# Patient Record
Sex: Female | Born: 1960 | Race: White | Hispanic: No | Marital: Single | Smoking: Never smoker
Health system: Southern US, Community
[De-identification: ages and names within clinical notes are randomized; demographics above are authoritative.]

## PROBLEM LIST (undated history)

## (undated) DIAGNOSIS — R519 Headache, unspecified: Secondary | ICD-10-CM

## (undated) DIAGNOSIS — C439 Malignant melanoma of skin, unspecified: Secondary | ICD-10-CM

## (undated) DIAGNOSIS — C4492 Squamous cell carcinoma of skin, unspecified: Secondary | ICD-10-CM

## (undated) DIAGNOSIS — B019 Varicella without complication: Secondary | ICD-10-CM

## (undated) DIAGNOSIS — R51 Headache: Secondary | ICD-10-CM

## (undated) DIAGNOSIS — I10 Essential (primary) hypertension: Secondary | ICD-10-CM

## (undated) DIAGNOSIS — G43909 Migraine, unspecified, not intractable, without status migrainosus: Secondary | ICD-10-CM

## (undated) DIAGNOSIS — T7840XA Allergy, unspecified, initial encounter: Secondary | ICD-10-CM

## (undated) HISTORY — DX: Allergy, unspecified, initial encounter: T78.40XA

## (undated) HISTORY — DX: Varicella without complication: B01.9

## (undated) HISTORY — DX: Migraine, unspecified, not intractable, without status migrainosus: G43.909

## (undated) HISTORY — DX: Headache, unspecified: R51.9

## (undated) HISTORY — DX: Malignant melanoma of skin, unspecified: C43.9

## (undated) HISTORY — DX: Essential (primary) hypertension: I10

## (undated) HISTORY — DX: Squamous cell carcinoma of skin, unspecified: C44.92

## (undated) HISTORY — PX: SKIN BIOPSY: SHX1

## (undated) HISTORY — DX: Headache: R51

---

## 2006-04-01 ENCOUNTER — Ambulatory Visit: Payer: Self-pay | Admitting: Internal Medicine

## 2006-05-27 ENCOUNTER — Ambulatory Visit: Payer: Self-pay | Admitting: Endocrinology

## 2007-09-17 ENCOUNTER — Ambulatory Visit: Payer: Self-pay | Admitting: Internal Medicine

## 2008-11-11 DIAGNOSIS — C439 Malignant melanoma of skin, unspecified: Secondary | ICD-10-CM

## 2008-11-11 HISTORY — DX: Malignant melanoma of skin, unspecified: C43.9

## 2008-12-07 ENCOUNTER — Ambulatory Visit: Payer: Self-pay | Admitting: Internal Medicine

## 2010-01-02 ENCOUNTER — Ambulatory Visit: Payer: Self-pay | Admitting: Internal Medicine

## 2010-02-08 DIAGNOSIS — C44612 Basal cell carcinoma of skin of right upper limb, including shoulder: Secondary | ICD-10-CM

## 2010-02-08 DIAGNOSIS — D239 Other benign neoplasm of skin, unspecified: Secondary | ICD-10-CM

## 2010-02-08 HISTORY — DX: Other benign neoplasm of skin, unspecified: D23.9

## 2010-02-08 HISTORY — DX: Basal cell carcinoma of skin of right upper limb, including shoulder: C44.612

## 2010-08-08 ENCOUNTER — Ambulatory Visit: Payer: Self-pay | Admitting: Gastroenterology

## 2010-08-31 DIAGNOSIS — L570 Actinic keratosis: Secondary | ICD-10-CM

## 2010-08-31 HISTORY — DX: Actinic keratosis: L57.0

## 2011-03-22 ENCOUNTER — Ambulatory Visit: Payer: Self-pay

## 2011-11-11 ENCOUNTER — Ambulatory Visit: Payer: Self-pay | Admitting: Internal Medicine

## 2012-04-21 ENCOUNTER — Ambulatory Visit: Payer: Self-pay | Admitting: Internal Medicine

## 2012-04-21 LAB — URINALYSIS, COMPLETE
Bilirubin,UR: NEGATIVE
Glucose,UR: NEGATIVE mg/dL (ref 0–75)
Ph: 6 (ref 4.5–8.0)
Protein: 30
Specific Gravity: 1.025 (ref 1.003–1.030)

## 2012-05-07 ENCOUNTER — Ambulatory Visit: Payer: Self-pay | Admitting: Internal Medicine

## 2012-05-29 ENCOUNTER — Ambulatory Visit: Payer: Self-pay

## 2013-06-16 ENCOUNTER — Ambulatory Visit: Payer: Self-pay

## 2014-08-04 ENCOUNTER — Ambulatory Visit: Payer: Self-pay | Admitting: Internal Medicine

## 2014-08-10 ENCOUNTER — Ambulatory Visit: Payer: Self-pay

## 2015-05-22 LAB — HM PAP SMEAR: HM PAP: NORMAL

## 2015-07-24 LAB — HM MAMMOGRAPHY: HM Mammogram: NEGATIVE

## 2015-09-12 ENCOUNTER — Ambulatory Visit (INDEPENDENT_AMBULATORY_CARE_PROVIDER_SITE_OTHER): Payer: BLUE CROSS/BLUE SHIELD | Admitting: Nurse Practitioner

## 2015-09-12 ENCOUNTER — Encounter: Payer: Self-pay | Admitting: Nurse Practitioner

## 2015-09-12 VITALS — BP 122/68 | HR 68 | Temp 97.8°F | Resp 16 | Ht 66.25 in | Wt 179.8 lb

## 2015-09-12 DIAGNOSIS — Z85828 Personal history of other malignant neoplasm of skin: Secondary | ICD-10-CM

## 2015-09-12 DIAGNOSIS — Z7189 Other specified counseling: Secondary | ICD-10-CM | POA: Diagnosis not present

## 2015-09-12 DIAGNOSIS — R51 Headache: Secondary | ICD-10-CM

## 2015-09-12 DIAGNOSIS — Z889 Allergy status to unspecified drugs, medicaments and biological substances status: Secondary | ICD-10-CM | POA: Diagnosis not present

## 2015-09-12 DIAGNOSIS — Z1231 Encounter for screening mammogram for malignant neoplasm of breast: Secondary | ICD-10-CM | POA: Insufficient documentation

## 2015-09-12 DIAGNOSIS — Z7689 Persons encountering health services in other specified circumstances: Secondary | ICD-10-CM

## 2015-09-12 DIAGNOSIS — E663 Overweight: Secondary | ICD-10-CM | POA: Diagnosis not present

## 2015-09-12 DIAGNOSIS — Z0001 Encounter for general adult medical examination with abnormal findings: Secondary | ICD-10-CM

## 2015-09-12 DIAGNOSIS — R519 Headache, unspecified: Secondary | ICD-10-CM

## 2015-09-12 MED ORDER — BUPROPION HCL ER (XL) 150 MG PO TB24: 150.0000 mg | ORAL_TABLET | Freq: Every day | ORAL | Status: DC

## 2015-09-12 NOTE — Progress Notes (Signed)
Pre visit review using our clinic review tool, if applicable. No additional management support is needed unless otherwise documented below in the visit note. 

## 2015-09-12 NOTE — Patient Instructions (Signed)
7 days of written down food, drink, and exercise over the next month.   Keep up the good work with exercise.   Nice to meet you!

## 2015-09-12 NOTE — Progress Notes (Signed)
Patient ID: April Wilkins, female    DOB: 1960/11/17  Age: 54 y.o. MRN: [id number]  CC: Establish Care   HPI April Wilkins presents for establishing care today and CC of questions about weight loss.  1) New pt info:  Immunizations- tdap- 2016 Flu- yesterday  Mammogram- 9/16 dense breast tissue    Pap- 05/2015 normal per pt  Colonoscopy- 2010 normal per pt  Eye Exam- 01/2015  LMP- 6 yrs ago  Last labs were completed on 07/24/15    Lipid panel normal    CMET normal   Iron studies- normal    CBC w/ diff 5.12 RBC, 47 HCT, 31.8 L MCHC  A1c 5.6  B12 and Folate- WNL  Ferritin WNL  Thyroid- WNL  Vitamin D 21 L    2) Chronic Problems-  Frequent headaches- 2-3 x a week, TMJ, grinds teeth   Seasonal allergies- congestion OTC medications   Santel- regular follow up after melanoma excision 5-6 yrs ago. She is very cautious about sun exposure.   3) Acute Problems-  Weight loss-  Treatment to date: Weight watchers- 1 year ago, successful, but reports being stuck.  Phentermine 6 years ago [facility]- Not helpful   Exercise- 4 days a week rowing, weight lift, cardio 1.5 hrs  Diet- Watching what she eats she reports   155-160 goal weight   History April Wilkins has a past medical history of Chicken pox; Frequent headaches; Allergy; Migraines; and Melanoma ([place]) (2010).   She has past surgical history that includes Skin biopsy.   Her family history includes Alcohol abuse in her paternal uncle; Cancer in her father; Diabetes in her father; Drug abuse in her paternal grandmother; Heart disease in her father, paternal grandmother, and paternal uncle; Hyperlipidemia in her father, paternal grandmother, and paternal uncle; Kidney disease in her maternal grandfather.She reports that she has never smoked. She has never used smokeless tobacco. She reports that she drinks alcohol. She reports that she does not use illicit drugs.  No outpatient prescriptions prior to visit.   No  facility-administered medications prior to visit.    ROS Review of Systems  Constitutional: Negative for fever, chills, diaphoresis and fatigue.  Respiratory: Negative for chest tightness, shortness of breath and wheezing.   Cardiovascular: Negative for chest pain, palpitations and leg swelling.  Gastrointestinal: Negative for nausea, vomiting and diarrhea.  Skin: Negative for rash.  Neurological: Negative for dizziness, weakness, numbness and headaches.  Psychiatric/Behavioral: The patient is not nervous/anxious.    Objective:  BP 122/68 mmHg  Pulse 68  Temp(Src) 97.8 F (36.6 C)  Resp 16  Ht 5' 6.25" (1.683 m)  Wt 179 lb 12.8 oz (81.557 kg)  BMI 28.79 kg/m2  SpO2 97%  Physical Exam  Constitutional: She is oriented to person, place, and time. She appears well-developed and well-nourished. No distress.  HENT:  Head: Normocephalic and atraumatic.  Right Ear: External ear normal.  Left Ear: External ear normal.  Cardiovascular: Normal rate, regular rhythm, normal heart sounds and intact distal pulses.  Exam reveals no gallop and no friction rub.   No murmur heard. Pulmonary/Chest: Effort normal and breath sounds normal. No respiratory distress. She has no wheezes. She has no rales. She exhibits no tenderness.  Neurological: She is alert and oriented to person, place, and time. No cranial nerve deficit. She exhibits normal muscle tone. Coordination normal.  Skin: Skin is warm and dry. No rash noted. She is not diaphoretic.  Psychiatric: She has a normal mood and  affect. Her behavior is normal. Judgment and thought content normal.   Assessment & Plan:   April Wilkins was seen today for establish care.  Diagnoses and all orders for this visit:  Overweight (BMI 25.0-29.9)  Encounter to establish care  Frequent headaches  H/O seasonal allergies  History of skin cancer  Other orders -     buPROPion (WELLBUTRIN XL) 150 MG 24 hr tablet; Take 1 tablet (150 mg total) by mouth  daily.   I am having Ms. Gilani start on buPROPion. I am also having her maintain her ADDERALL XR.  Meds ordered this encounter  Medications  . ADDERALL XR 30 MG 24 hr capsule    Sig: Take 1 capsule by mouth daily.    Refill:  0  . buPROPion (WELLBUTRIN XL) 150 MG 24 hr tablet    Sig: Take 1 tablet (150 mg total) by mouth daily.    Dispense:  30 tablet    Refill:  0    Order Specific Question:  Supervising Provider    Answer:  Crecencio Mc [[id number]]     Follow-up: Return in about 4 weeks (around 10/10/2015) for Weight loss.

## 2015-09-17 DIAGNOSIS — Z85828 Personal history of other malignant neoplasm of skin: Secondary | ICD-10-CM | POA: Insufficient documentation

## 2015-09-17 DIAGNOSIS — Z889 Allergy status to unspecified drugs, medicaments and biological substances status: Secondary | ICD-10-CM | POA: Insufficient documentation

## 2015-09-17 DIAGNOSIS — R51 Headache: Secondary | ICD-10-CM

## 2015-09-17 DIAGNOSIS — R519 Headache, unspecified: Secondary | ICD-10-CM | POA: Insufficient documentation

## 2015-09-17 NOTE — Assessment & Plan Note (Addendum)
OTC medications are helpful. Will follow as needed

## 2015-09-17 NOTE — Assessment & Plan Note (Signed)
Discussed acute and chronic issues. Reviewed health maintenance measures, PFSHx, and immunizations. Obtain records from previous facility.   

## 2015-09-17 NOTE — Assessment & Plan Note (Signed)
Patient reports this is related to grinding teeth and TMJ. Follow as needed

## 2015-09-17 NOTE — Assessment & Plan Note (Signed)
Pt interested in trying Wellbutrin for weight loss. Discussed risks and benefits. Pt is aware of small risk of suicidal ideations and understands to let myself or someone know this is happening. Pt will follow up in 1 month. Discussed possibly using weight watchers program with this and continue exercising regularly.

## 2015-09-17 NOTE — Assessment & Plan Note (Signed)
Close follow-up with [facility]. Excision was 5-6 years ago. Patient reports she is cautious about sun exposure

## 2015-11-22 ENCOUNTER — Encounter: Payer: Self-pay | Admitting: Emergency Medicine

## 2015-11-22 ENCOUNTER — Ambulatory Visit
Admission: EM | Admit: 2015-11-22 | Discharge: 2015-11-22 | Disposition: A | Discharge status: Home / Self Care | Payer: BLUE CROSS/BLUE SHIELD | Attending: Family Medicine | Admitting: Family Medicine

## 2015-11-22 DIAGNOSIS — J0100 Acute maxillary sinusitis, unspecified: Secondary | ICD-10-CM | POA: Diagnosis not present

## 2015-11-22 DIAGNOSIS — R059 Cough, unspecified: Secondary | ICD-10-CM

## 2015-11-22 DIAGNOSIS — R05 Cough: Secondary | ICD-10-CM | POA: Diagnosis not present

## 2015-11-22 MED ORDER — AMOXICILLIN-POT CLAVULANATE 875-125 MG PO TABS: 1.0000 | ORAL_TABLET | Freq: Two times a day (BID) | ORAL | Status: DC

## 2015-11-22 MED ORDER — GUAIFENESIN-CODEINE 100-10 MG/5ML PO SOLN: ORAL | Status: DC

## 2015-11-22 NOTE — ED Provider Notes (Signed)
CSN: [id number]     Arrival date & time 11/22/15  [id number] History   First MD Initiated Contact with Patient 11/22/15 0840     Chief Complaint  Patient presents with  . Facial Pain  . Headache  . Cough   (Consider location/radiation/quality/duration/timing/severity/associated sxs/prior Treatment) Patient is a 55 y.o. female presenting with headaches, cough, and URI. The history is provided by the patient.  Headache Associated symptoms: congestion, cough and URI   Cough Associated symptoms: headaches and rhinorrhea   URI Presenting symptoms: congestion, cough and rhinorrhea   Severity:  Moderate Onset quality:  Sudden Timing:  Constant Progression:  Worsening Chronicity:  New Associated symptoms: headaches     Past Medical History  Diagnosis Date  . Chicken pox   . Frequent headaches   . Allergy     Seasonal  . Migraines   . Melanoma ([place]) 2010   Past Surgical History  Procedure Laterality Date  . Skin biopsy     Family History  Problem Relation Age of Onset  . Hyperlipidemia Father   . Heart disease Father   . Diabetes Father   . Cancer Father     Skin Cancer  . Alcohol abuse Paternal Uncle   . Hyperlipidemia Paternal Uncle   . Heart disease Paternal Uncle   . Kidney disease Maternal Grandfather   . Hyperlipidemia Paternal Grandmother   . Heart disease Paternal Grandmother   . Drug abuse Paternal Grandmother    Social History  Substance Use Topics  . Smoking status: Never Smoker   . Smokeless tobacco: Never Used  . Alcohol Use: 0.0 oz/week    0 Standard drinks or equivalent per week     Comment: Rare    OB History    No data available     Review of Systems  HENT: Positive for congestion and rhinorrhea.   Respiratory: Positive for cough.   Neurological: Positive for headaches.    Allergies  Review of patient's allergies indicates no known allergies.  Home Medications   Prior to Admission medications   Medication Sig Start Date End Date Taking?  Authorizing Provider  ADDERALL XR 30 MG 24 hr capsule Take 1 capsule by mouth daily. 07/23/15   Historical Provider, MD  amoxicillin-clavulanate (AUGMENTIN) 875-125 MG tablet Take 1 tablet by mouth 2 (two) times daily. 11/22/15   Norval Gable, MD  buPROPion (WELLBUTRIN XL) 150 MG 24 hr tablet Take 1 tablet (150 mg total) by mouth daily. 09/12/15   Rubbie Battiest, NP  guaiFENesin-codeine 100-10 MG/5ML syrup 10 ml po qhs prn cough 11/22/15   Norval Gable, MD   Meds Ordered and Administered this Visit  Medications - No data to display  BP 146/86 mmHg  Pulse 74  Temp(Src) 97 F (36.1 C) (Tympanic)  Resp 16  Ht 5\' 6"  (1.676 m)  Wt 170 lb (77.111 kg)  BMI 27.45 kg/m2  SpO2 100% No data found.   Physical Exam  Constitutional: She appears well-developed and well-nourished. No distress.  HENT:  Head: Normocephalic and atraumatic.  Right Ear: Tympanic membrane, external ear and ear canal normal.  Left Ear: Tympanic membrane, external ear and ear canal normal.  Nose: Mucosal edema and rhinorrhea present. No nose lacerations, sinus tenderness, nasal deformity, septal deviation or nasal septal hematoma. No epistaxis.  No foreign bodies. Right sinus exhibits maxillary sinus tenderness and frontal sinus tenderness. Left sinus exhibits maxillary sinus tenderness and frontal sinus tenderness.  Mouth/Throat: Uvula is midline, oropharynx is clear and moist and  mucous membranes are normal. No oropharyngeal exudate.  Eyes: Conjunctivae and EOM are normal. Pupils are equal, round, and reactive to light. Right eye exhibits no discharge. Left eye exhibits no discharge. No scleral icterus.  Neck: Normal range of motion. Neck supple. No thyromegaly present.  Cardiovascular: Normal rate, regular rhythm and normal heart sounds.   Pulmonary/Chest: Effort normal and breath sounds normal. No respiratory distress. She has no wheezes. She has no rales.  Lymphadenopathy:    She has no cervical adenopathy.  Skin: No  rash noted. She is not diaphoretic.  Nursing note and vitals reviewed.   ED Course  Procedures (including critical care time)  Labs Review Labs Reviewed - No data to display  Imaging Review No results found.   Visual Acuity Review  Right Eye Distance:   Left Eye Distance:   Bilateral Distance:    Right Eye Near:   Left Eye Near:    Bilateral Near:         MDM   1. Acute maxillary sinusitis, recurrence not specified   2. Cough    Discharge Medication List as of 11/22/2015  8:56 AM    START taking these medications   Details  amoxicillin-clavulanate (AUGMENTIN) 875-125 MG tablet Take 1 tablet by mouth 2 (two) times daily., Starting 11/22/2015, Until Discontinued, Normal    guaiFENesin-codeine 100-10 MG/5ML syrup 10 ml po qhs prn cough, Print       1. Labs/x-ray results and diagnosis reviewed with patient/parent/guardian/family 2. rx as per orders above; reviewed possible side effects, interactions, risks and benefits  3. Recommend supportive treatment with otc flonase, analgesics, increased water intake 4. Follow-up prn if symptoms worsen or don't improve    Norval Gable, MD 11/22/15 [phone]

## 2015-11-22 NOTE — ED Notes (Signed)
Patient c/o sinus pain and pressure, lip sore, nasal congestion, red watery eyes and cough for a week.  Patient denies fevers.

## 2016-04-15 ENCOUNTER — Other Ambulatory Visit: Payer: Self-pay | Admitting: Nurse Practitioner

## 2016-04-15 DIAGNOSIS — M545 Low back pain: Secondary | ICD-10-CM

## 2016-04-16 ENCOUNTER — Ambulatory Visit
Admission: RE | Admit: 2016-04-16 | Discharge: 2016-04-16 | Disposition: A | Discharge status: Home / Self Care | Payer: BLUE CROSS/BLUE SHIELD | Source: Ambulatory Visit | Attending: Nurse Practitioner | Admitting: Nurse Practitioner

## 2016-04-16 DIAGNOSIS — M545 Low back pain: Secondary | ICD-10-CM

## 2016-05-06 ENCOUNTER — Other Ambulatory Visit: Payer: Self-pay | Admitting: Orthopedic Surgery

## 2016-05-06 DIAGNOSIS — M5417 Radiculopathy, lumbosacral region: Secondary | ICD-10-CM

## 2016-05-06 DIAGNOSIS — M5442 Lumbago with sciatica, left side: Secondary | ICD-10-CM

## 2016-05-10 ENCOUNTER — Ambulatory Visit
Admission: RE | Admit: 2016-05-10 | Discharge: 2016-05-10 | Disposition: A | Discharge status: Home / Self Care | Payer: BLUE CROSS/BLUE SHIELD | Source: Ambulatory Visit | Attending: Orthopedic Surgery | Admitting: Orthopedic Surgery

## 2016-05-10 ENCOUNTER — Ambulatory Visit: Payer: BLUE CROSS/BLUE SHIELD

## 2016-05-10 DIAGNOSIS — M5127 Other intervertebral disc displacement, lumbosacral region: Secondary | ICD-10-CM | POA: Diagnosis not present

## 2016-05-10 DIAGNOSIS — M4806 Spinal stenosis, lumbar region: Secondary | ICD-10-CM | POA: Insufficient documentation

## 2016-05-10 DIAGNOSIS — M5442 Lumbago with sciatica, left side: Secondary | ICD-10-CM | POA: Insufficient documentation

## 2016-05-10 DIAGNOSIS — M5417 Radiculopathy, lumbosacral region: Secondary | ICD-10-CM | POA: Insufficient documentation

## 2017-01-22 ENCOUNTER — Other Ambulatory Visit: Payer: BLUE CROSS/BLUE SHIELD

## 2017-01-23 DIAGNOSIS — M5126 Other intervertebral disc displacement, lumbar region: Secondary | ICD-10-CM | POA: Insufficient documentation

## 2017-01-29 ENCOUNTER — Ambulatory Visit: Admit: 2017-01-29 | Payer: BLUE CROSS/BLUE SHIELD | Admitting: Neurological Surgery

## 2017-01-29 SURGERY — LUMBAR LAMINECTOMY/DECOMPRESSION MICRODISCECTOMY 1 LEVEL
Anesthesia: Choice | Laterality: Left

## 2017-10-29 ENCOUNTER — Ambulatory Visit: Payer: BLUE CROSS/BLUE SHIELD | Admitting: Nurse Practitioner

## 2017-10-29 ENCOUNTER — Encounter: Payer: Self-pay | Admitting: Nurse Practitioner

## 2017-10-29 VITALS — BP 120/80 | HR 80 | Resp 16 | Ht 66.5 in | Wt 165.0 lb

## 2017-10-29 DIAGNOSIS — F988 Other specified behavioral and emotional disorders with onset usually occurring in childhood and adolescence: Secondary | ICD-10-CM | POA: Diagnosis not present

## 2017-10-29 MED ORDER — ADDERALL XR 30 MG PO CP24: 30.0000 mg | ORAL_CAPSULE | Freq: Every day | ORAL | 0 refills | Status: DC

## 2017-10-29 NOTE — Patient Instructions (Addendum)

## 2017-10-29 NOTE — Progress Notes (Signed)
Subjective:     Patient ID: April Wilkins, female   DOB: 31-Jan-1961, 56 y.o.   MRN: [id number]  The patient is her e to have refills of Adderall XR 30mg . Does take this every day when working. This medication does help to keep her focused and on track. She has no negative symptoms to report.    Current Meds  Medication Sig  . acetaminophen (TYLENOL) 325 MG tablet Take by mouth.  . ADDERALL XR 30 MG 24 hr capsule Take 1 capsule (30 mg total) by mouth daily.  Marland Kitchen ibuprofen (ADVIL,MOTRIN) 200 MG tablet Take by mouth.  . [DISCONTINUED] ADDERALL XR 30 MG 24 hr capsule Take 1 capsule by mouth daily.  . [DISCONTINUED] ADDERALL XR 30 MG 24 hr capsule Take 1 capsule (30 mg total) by mouth daily.  . [DISCONTINUED] ADDERALL XR 30 MG 24 hr capsule Take 1 capsule (30 mg total) by mouth daily.    Review of Systems  Constitutional: Negative.   HENT: Negative.   Respiratory: Negative.   Gastrointestinal: Negative.   Endocrine: Negative.   Genitourinary: Negative.   Musculoskeletal: Negative.   Skin: Negative.   Allergic/Immunologic: Negative.   Neurological: Negative.   Hematological: Negative.   Psychiatric/Behavioral: Positive for decreased concentration.    Today's Vitals   10/29/17 0935  BP: 120/80  Pulse: 80  Resp: 16  SpO2: 96%  Weight: 165 lb (74.8 kg)  Height: 5' 6.5" (1.689 m)   Objective:   Physical Exam  Constitutional: She is oriented to person, place, and time. She appears well-developed and well-nourished.  HENT:  Head: Normocephalic.  Eyes: Pupils are equal, round, and reactive to light.  Neck: Normal range of motion. Neck supple.  Cardiovascular: Normal rate and regular rhythm.  Pulmonary/Chest: Effort normal and breath sounds normal.  Abdominal: Soft. There is no tenderness.  Musculoskeletal: Normal range of motion.  Neurological: She is alert and oriented to person, place, and time.  Skin: Skin is warm and dry.  Psychiatric: She has a normal mood and affect.        Assessment:     Attention deficit disorder (ADD) in adult - Plan: ADDERALL XR 30 MG 24 hr capsule, DISCONTINUED: ADDERALL XR 30 MG 24 hr capsule, DISCONTINUED: ADDERALL XR 30 MG 24 hr capsule     Plan:     1. Adult ADD - refilled adderall XR 30mg  for next 3 consecutive months. Dates are 10/29/2017, [id number], and 12/26/2017  folow up 3 months and sooner if needed

## 2018-01-27 ENCOUNTER — Ambulatory Visit: Payer: Self-pay | Admitting: Nurse Practitioner

## 2018-02-20 ENCOUNTER — Ambulatory Visit: Payer: BLUE CROSS/BLUE SHIELD | Admitting: Nurse Practitioner

## 2018-02-20 ENCOUNTER — Encounter: Payer: Self-pay | Admitting: Nurse Practitioner

## 2018-02-20 DIAGNOSIS — F988 Other specified behavioral and emotional disorders with onset usually occurring in childhood and adolescence: Secondary | ICD-10-CM | POA: Diagnosis not present

## 2018-02-20 MED ORDER — ADDERALL XR 30 MG PO CP24
30.0000 mg | ORAL_CAPSULE | Freq: Every day | ORAL | 0 refills | Status: DC
Start: 2018-02-20 — End: 2018-02-20

## 2018-02-20 MED ORDER — ADDERALL XR 30 MG PO CP24: 30.0000 mg | ORAL_CAPSULE | Freq: Every day | ORAL | 0 refills | Status: DC

## 2018-02-20 NOTE — Progress Notes (Signed)
[facility] [place], [place] [address]  Internal MEDICINE  Office Visit Note  Patient Name: April Wilkins  [id number]  [id number]  Date of Service: 02/21/2018  Chief Complaint  Patient presents with  . Fatigue    The patient is here for routine follow up visit. She is currently taking adderall XR 30mg  daily. She takes this only on work days, and does not take every work day. This medication helps to keep her focused and on track at work. Helps her to stay productive and does not cause any negative side effects. She needs to have refills of this medication today.    Pt is here for routine follow up.    Current Medication: Outpatient Encounter Medications as of 02/20/2018  Medication Sig  . acetaminophen (TYLENOL) 325 MG tablet Take by mouth.  . ADDERALL XR 30 MG 24 hr capsule Take 1 capsule (30 mg total) by mouth daily.  Marland Kitchen ibuprofen (ADVIL,MOTRIN) 200 MG tablet Take by mouth.  . [DISCONTINUED] ADDERALL XR 30 MG 24 hr capsule Take 1 capsule (30 mg total) by mouth daily.  . [DISCONTINUED] ADDERALL XR 30 MG 24 hr capsule Take 1 capsule (30 mg total) by mouth daily.  . [DISCONTINUED] ADDERALL XR 30 MG 24 hr capsule Take 1 capsule (30 mg total) by mouth daily.   No facility-administered encounter medications on file as of 02/20/2018.     Surgical History: Past Surgical History:  Procedure Laterality Date  . SKIN BIOPSY      Medical History: Past Medical History:  Diagnosis Date  . Allergy    Seasonal  . Chicken pox   . Frequent headaches   . Melanoma ([place]) 2010  . Migraines     Family History: Family History  Problem Relation Age of Onset  . Hyperlipidemia Father   . Heart disease Father   . Diabetes Father   . Cancer Father        Skin Cancer  . Alcohol abuse Paternal Uncle   . Hyperlipidemia Paternal Uncle   . Heart disease Paternal Uncle   . Kidney disease Maternal Grandfather   . Hyperlipidemia Paternal Grandmother   . Heart  disease Paternal Grandmother   . Drug abuse Paternal Grandmother     Social History   Socioeconomic History  . Marital status: Married    Spouse name: Not on file  . Number of children: Not on file  . Years of education: Not on file  . Highest education level: Not on file  Occupational History  . Not on file  Social Needs  . Financial resource strain: Not on file  . Food insecurity:    Worry: Not on file    Inability: Not on file  . Transportation needs:    Medical: Not on file    Non-medical: Not on file  Tobacco Use  . Smoking status: Never Smoker  . Smokeless tobacco: Never Used  Substance and Sexual Activity  . Alcohol use: Yes    Alcohol/week: 0.0 oz    Comment: Rare   . Drug use: No  . Sexual activity: Yes    Partners: Male  Lifestyle  . Physical activity:    Days per week: Not on file    Minutes per session: Not on file  . Stress: Not on file  Relationships  . Social connections:    Talks on phone: Not on file    Gets together: Not on file    Attends religious service: Not on  file    Active member of club or organization: Not on file    Attends meetings of clubs or organizations: Not on file    Relationship status: Not on file  . Intimate partner violence:    Fear of current or ex partner: Not on file    Emotionally abused: Not on file    Physically abused: Not on file    Forced sexual activity: Not on file  Other Topics Concern  . Not on file  Social History Narrative   Divorced   Goes by [name]    2 children   Caffeine- No coffee, tea and soda daily 24 oz.        Review of Systems  Constitutional: Negative for activity change, fatigue and unexpected weight change.  HENT: Negative for congestion, sinus pressure, sinus pain and sore throat.   Eyes: Negative.   Respiratory: Negative for chest tightness, shortness of breath and wheezing.   Cardiovascular: Negative for chest pain and palpitations.   Gastrointestinal: Negative for abdominal pain, diarrhea, nausea and vomiting.  Endocrine: Negative for cold intolerance, heat intolerance, polydipsia, polyphagia and polyuria.  Genitourinary: Negative.   Musculoskeletal: Negative for arthralgias and back pain.  Skin: Negative for rash.  Allergic/Immunologic: Negative for environmental allergies.  Neurological: Negative for dizziness, weakness and headaches.  Hematological: Negative for adenopathy.  Psychiatric/Behavioral: Positive for decreased concentration.    Vital Signs: BP 130/84   Pulse 75   Resp 16   Ht 5\' 6"  (1.676 m)   Wt 162 lb 3.2 oz (73.6 kg)   SpO2 99%   BMI 26.18 kg/m    Physical Exam  Constitutional: She is oriented to person, place, and time. She appears well-developed and well-nourished.  HENT:  Head: Normocephalic and atraumatic.  Eyes: Pupils are equal, round, and reactive to light. EOM are normal.  Neck: Normal range of motion. Neck supple. No thyromegaly present.  Cardiovascular: Normal rate and regular rhythm.  Pulmonary/Chest: Effort normal and breath sounds normal. She has no wheezes.  Abdominal: Soft. Bowel sounds are normal. There is no tenderness.  Musculoskeletal: Normal range of motion.  Lymphadenopathy:    She has no cervical adenopathy.  Neurological: She is alert and oriented to person, place, and time.  Skin: Skin is warm and dry.  Psychiatric: She has a normal mood and affect. Her behavior is normal. Judgment and thought content normal.  Nursing note and vitals reviewed.  Assessment/Plan: 1. Attention deficit disorder (ADD) in adult Ok to continue adderall XR 30mg  capsules daily as needed. Three 30 day prescriptions provided today. Dates are 02/20/2018, 03/20/2018, and 04/18/2018. - ADDERALL XR 30 MG 24 hr capsule; Take 1 capsule (30 mg total) by mouth daily.  Dispense: 30 capsule; Refill: 0  General Counseling: April Wilkins verbalizes understanding of the findings of todays visit and agrees with  plan of treatment. I have discussed any further diagnostic evaluation that may be needed or ordered today. We also reviewed her medications today. she has been encouraged to call the office with any questions or concerns that should arise related to todays visit.  This patient was seen by Leretha Pol, FNP- C in Collaboration with Dr Lavera Guise as a part of collaborative care agreement  Meds ordered this encounter  Medications  . DISCONTD: ADDERALL XR 30 MG 24 hr capsule    Sig: Take 1 capsule (30 mg total) by mouth daily.    Dispense:  30 capsule  Refill:  0    Order Specific Question:   Supervising Provider    Answer:   Lavera Guise [[id number]]  . DISCONTD: ADDERALL XR 30 MG 24 hr capsule    Sig: Take 1 capsule (30 mg total) by mouth daily.    Dispense:  30 capsule    Refill:  0    Fill after 03/20/2018    Order Specific Question:   Supervising Provider    Answer:   Lavera Guise [[id number]]  . ADDERALL XR 30 MG 24 hr capsule    Sig: Take 1 capsule (30 mg total) by mouth daily.    Dispense:  30 capsule    Refill:  0    Fill after 04/18/2018    Order Specific Question:   Supervising Provider    Answer:   Lavera Guise [[id number]]    Time spent: 27 Minutes   Dr Lavera Guise Internal medicine

## 2018-02-21 DIAGNOSIS — F988 Other specified behavioral and emotional disorders with onset usually occurring in childhood and adolescence: Secondary | ICD-10-CM | POA: Insufficient documentation

## 2018-03-16 ENCOUNTER — Other Ambulatory Visit: Payer: Self-pay | Admitting: Nurse Practitioner

## 2018-05-21 ENCOUNTER — Encounter: Payer: Self-pay | Admitting: Nurse Practitioner

## 2018-05-21 ENCOUNTER — Encounter (INDEPENDENT_AMBULATORY_CARE_PROVIDER_SITE_OTHER): Payer: Self-pay

## 2018-05-21 ENCOUNTER — Ambulatory Visit (INDEPENDENT_AMBULATORY_CARE_PROVIDER_SITE_OTHER): Payer: BLUE CROSS/BLUE SHIELD | Admitting: Nurse Practitioner

## 2018-05-21 VITALS — BP 148/81 | HR 84 | Resp 16 | Ht 67.0 in | Wt 165.8 lb

## 2018-05-21 DIAGNOSIS — N390 Urinary tract infection, site not specified: Secondary | ICD-10-CM | POA: Diagnosis not present

## 2018-05-21 DIAGNOSIS — F988 Other specified behavioral and emotional disorders with onset usually occurring in childhood and adolescence: Secondary | ICD-10-CM | POA: Diagnosis not present

## 2018-05-21 DIAGNOSIS — Z124 Encounter for screening for malignant neoplasm of cervix: Secondary | ICD-10-CM | POA: Diagnosis not present

## 2018-05-21 DIAGNOSIS — Z0001 Encounter for general adult medical examination with abnormal findings: Secondary | ICD-10-CM

## 2018-05-21 DIAGNOSIS — R300 Dysuria: Secondary | ICD-10-CM

## 2018-05-21 MED ORDER — ADDERALL XR 30 MG PO CP24: 30.0000 mg | ORAL_CAPSULE | Freq: Every day | ORAL | 0 refills | Status: DC

## 2018-05-21 NOTE — Progress Notes (Signed)
[facility] [place], [place] [address]  Internal MEDICINE  Office Visit Note  Patient Name: April Wilkins  [id number]  [id number]  Date of Service: 05/27/2018   Pt is here for routine health maintenance examination  Chief Complaint  Patient presents with  . Annual Exam  . Gynecologic Exam     The patient is here for routine follow up visit. She is currently taking adderall XR 30mg  daily. She takes this only on work days, and does not take every work day. This medication helps to keep her focused and on track at work. Helps her to stay productive and does not cause any negative side effects. She needs to have refills of this medication today.     Current Medication: Outpatient Encounter Medications as of 05/21/2018  Medication Sig  . acetaminophen (TYLENOL) 325 MG tablet Take by mouth.  . ADDERALL XR 30 MG 24 hr capsule Take 1 capsule (30 mg total) by mouth daily.  Marland Kitchen ibuprofen (ADVIL,MOTRIN) 200 MG tablet Take by mouth.  . [DISCONTINUED] ADDERALL XR 30 MG 24 hr capsule Take 1 capsule (30 mg total) by mouth daily.  . [DISCONTINUED] ADDERALL XR 30 MG 24 hr capsule Take 1 capsule (30 mg total) by mouth daily.  . [DISCONTINUED] ADDERALL XR 30 MG 24 hr capsule Take 1 capsule (30 mg total) by mouth daily.  . nitrofurantoin, macrocrystal-monohydrate, (MACROBID) 100 MG capsule Take 1 capsule (100 mg total) by mouth 2 (two) times daily.   No facility-administered encounter medications on file as of 05/21/2018.     Surgical History: Past Surgical History:  Procedure Laterality Date  . SKIN BIOPSY      Medical History: Past Medical History:  Diagnosis Date  . Allergy    Seasonal  . Chicken pox   . Frequent headaches   . Melanoma ([place]) 2010  . Migraines     Family History: Family History  Problem Relation Age of Onset  . Hyperlipidemia Father   . Heart disease Father   . Diabetes Father   . Cancer Father        Skin Cancer  . Alcohol  abuse Paternal Uncle   . Hyperlipidemia Paternal Uncle   . Heart disease Paternal Uncle   . Kidney disease Maternal Grandfather   . Hyperlipidemia Paternal Grandmother   . Heart disease Paternal Grandmother   . Drug abuse Paternal Grandmother       Review of Systems  Constitutional: Negative for activity change, fatigue and unexpected weight change.  HENT: Negative for congestion, sinus pressure, sinus pain and sore throat.   Eyes: Negative.   Respiratory: Negative for chest tightness, shortness of breath and wheezing.   Cardiovascular: Negative for chest pain and palpitations.  Gastrointestinal: Negative for abdominal pain, diarrhea, nausea and vomiting.  Endocrine: Negative for cold intolerance, heat intolerance, polydipsia, polyphagia and polyuria.  Genitourinary: Negative for dysuria, frequency, hematuria, vaginal bleeding and vaginal discharge.  Musculoskeletal: Negative for arthralgias, back pain and myalgias.  Skin: Negative for rash.  Allergic/Immunologic: Negative for environmental allergies.  Neurological: Negative for dizziness, weakness and headaches.  Hematological: Negative for adenopathy.  Psychiatric/Behavioral: Positive for decreased concentration.     Today's Vitals   05/21/18 1133  BP: (!) 148/81  Pulse: 84  Resp: 16  SpO2: 99%  Weight: 165 lb 12.8 oz (75.2 kg)  Height: 5\' 7"  (1.702 m)    Physical Exam  Constitutional: She is oriented to person, place, and time. She appears well-developed and well-nourished.  HENT:  Head: Normocephalic and atraumatic.  Nose: Nose normal.  Eyes: Pupils are equal, round, and reactive to light. Conjunctivae and EOM are normal.  Neck: Normal range of motion. Neck supple. No JVD present. Carotid bruit is not present. No tracheal deviation present. No thyromegaly present.  Cardiovascular: Normal rate, regular rhythm, normal heart sounds and intact distal pulses.  Pulmonary/Chest: Effort normal and breath sounds normal. No  respiratory distress. She exhibits no tenderness. Right breast exhibits no inverted nipple, no mass, no nipple discharge, no skin change and no tenderness. Left breast exhibits no inverted nipple, no mass, no nipple discharge, no skin change and no tenderness.  Abdominal: Soft. Bowel sounds are normal. There is no tenderness.  Genitourinary: Vagina normal and uterus normal.  Genitourinary Comments: No tenderness, masses, or organomeglay present during bimanual exam .  Musculoskeletal: Normal range of motion.  Lymphadenopathy:    She has no cervical adenopathy.  Neurological: She is alert and oriented to person, place, and time.  Skin: Skin is warm and dry. Capillary refill takes less than 2 seconds.  Psychiatric: She has a normal mood and affect. Her behavior is normal. Judgment and thought content normal.  Nursing note and vitals reviewed.    LABS: Recent Results (from the past 2160 hour(s))  UA/M w/rflx Culture, Routine     Status: Abnormal   Collection Time: 05/21/18 12:00 PM  Result Value Ref Range   Specific Gravity, UA 1.017 1.005 - 1.030   pH, UA 5.0 5.0 - 7.5   Color, UA Yellow Yellow   Appearance Ur Cloudy (A) Clear   Leukocytes, UA 3+ (A) Negative   Protein, UA Negative Negative/Trace   Glucose, UA Negative Negative   Ketones, UA 1+ (A) Negative   RBC, UA 1+ (A) Negative   Bilirubin, UA Negative Negative   Urobilinogen, Ur 0.2 0.2 - 1.0 mg/dL   Nitrite, UA Positive (A) Negative   Microscopic Examination See below:     Comment: Microscopic was indicated and was performed.   Urinalysis Reflex Comment     Comment: This specimen has reflexed to a Urine Culture.  Pap IG and HPV (high risk) DNA detection     Status: None   Collection Time: 05/21/18 12:00 PM  Result Value Ref Range   DIAGNOSIS: Comment     Comment: NEGATIVE FOR INTRAEPITHELIAL LESION OR MALIGNANCY.   Specimen adequacy: Comment     Comment: Satisfactory for evaluation. Endocervical and/or squamous  metaplastic cells (endocervical component) are present.    Clinician Provided ICD10 Comment     Comment: Z12.4   Performed by: Comment     Comment: Dondra Spry, Cytotechnologist (ASCP)   PAP Smear Comment .    Note: Comment     Comment: The Pap smear is a screening test designed to aid in the detection of premalignant and malignant conditions of the uterine cervix.  It is not a diagnostic procedure and should not be used as the sole means of detecting cervical cancer.  Both false-positive and false-negative reports do occur.    Test Methodology Comment     Comment: This liquid based ThinPrep(R) pap test was screened with the use of an image guided system.    HPV, high-risk Negative Negative    Comment: This high-risk HPV test detects thirteen high-risk types (16/18/31/33/35/39/45/51/52/56/58/59/68) without differentiation.   Microscopic Examination     Status: Abnormal   Collection Time: 05/21/18 12:00 PM  Result Value Ref Range   WBC, UA >30 (A) 0 - 5 /  hpf    Comment: Clumps of leukocytes present.   RBC, UA 3-10 (A) 0 - 2 /hpf   Epithelial Cells (non renal) >10 (A) 0 - 10 /hpf   Casts None seen None seen /lpf   Mucus, UA Present Not Estab.   Bacteria, UA Many (A) None seen/Few  Urine Culture, Reflex     Status: Abnormal   Collection Time: 05/21/18 12:00 PM  Result Value Ref Range   Urine Culture, Routine CANCELED (A)     Comment: No specimen received for microbiology testing.  Result canceled by the ancillary.   Specimen status report     Status: None (Preliminary result)   Collection Time: 05/21/18 12:00 PM  Result Value Ref Range   specimen status report Comment     Comment: No Micro Urine Received    Assessment/Plan: 1. Encounter for general adult medical examination with abnormal findings Annual health maintenance exam performed today  2. Urinary tract infection without hematuria, site unspecified Positive infection. Treat with macrobid. Adjust abx based on  results of culture and sensitivity.  - nitrofurantoin, macrocrystal-monohydrate, (MACROBID) 100 MG capsule; Take 1 capsule (100 mg total) by mouth 2 (two) times daily.  Dispense: 20 capsule; Refill: 0  3. Dysuria - UA/M w/rflx Culture, Routine  4. Attention deficit disorder (ADD) in adult May continue Adderall XR 30mg  daily. Three 30 day prescription were provided. Dates are 05/21/2018, 06/19/2018, and 07/18/2018 - ADDERALL XR 30 MG 24 hr capsule; Take 1 capsule (30 mg total) by mouth daily.  Dispense: 30 capsule; Refill: 0  5. Screening for malignant neoplasm of cervix - Pap IG and HPV (high risk) DNA detection  General Counseling: Channel verbalizes understanding of the findings of todays visit and agrees with plan of treatment. I have discussed any further diagnostic evaluation that may be needed or ordered today. We also reviewed her medications today. she has been encouraged to call the office with any questions or concerns that should arise related to todays visit.    Counseling:  This patient was seen by Leretha Pol FNP Collaboration with Dr Lavera Guise as a part of collaborative care agreement  Orders Placed This Encounter  Procedures  . Microscopic Examination  . Urine Culture, Reflex  . UA/M w/rflx Culture, Routine  . Specimen status report    Meds ordered this encounter  Medications  . DISCONTD: ADDERALL XR 30 MG 24 hr capsule    Sig: Take 1 capsule (30 mg total) by mouth daily.    Dispense:  30 capsule    Refill:  0    Order Specific Question:   Supervising Provider    Answer:   Lavera Guise [[id number]]  . DISCONTD: ADDERALL XR 30 MG 24 hr capsule    Sig: Take 1 capsule (30 mg total) by mouth daily.    Dispense:  30 capsule    Refill:  0    Fill after 06/19/2018    Order Specific Question:   Supervising Provider    Answer:   Lavera Guise [[id number]]  . ADDERALL XR 30 MG 24 hr capsule    Sig: Take 1 capsule (30 mg total) by mouth daily.    Dispense:  30 capsule    Refill:   0    Fill after 07/18/2018    Order Specific Question:   Supervising Provider    Answer:   Lavera Guise [[id number]]  . nitrofurantoin, macrocrystal-monohydrate, (MACROBID) 100 MG capsule    Sig: Take 1 capsule (100  mg total) by mouth 2 (two) times daily.    Dispense:  20 capsule    Refill:  0    Order Specific Question:   Supervising Provider    Answer:   Lavera Guise [[id number]]    Time spent: [place], MD  Internal Medicine

## 2018-05-22 LAB — SPECIMEN STATUS REPORT

## 2018-05-25 LAB — PAP IG AND HPV HIGH-RISK
HPV, high-risk: NEGATIVE
PAP SMEAR COMMENT: 0

## 2018-05-26 ENCOUNTER — Telehealth: Payer: Self-pay

## 2018-05-26 LAB — UA/M W/RFLX CULTURE, ROUTINE
Bilirubin, UA: NEGATIVE
Glucose, UA: NEGATIVE
Nitrite, UA: POSITIVE — AB
PH UA: 5 (ref 5.0–7.5)
PROTEIN UA: NEGATIVE
Specific Gravity, UA: 1.017 (ref 1.005–1.030)
Urobilinogen, Ur: 0.2 mg/dL (ref 0.2–1.0)

## 2018-05-26 LAB — MICROSCOPIC EXAMINATION
CASTS: NONE SEEN /LPF
Epithelial Cells (non renal): >10 /hpf — AB (ref 0–10)

## 2018-05-26 LAB — URINE CULTURE, REFLEX

## 2018-05-26 NOTE — Telephone Encounter (Signed)
-----   Message from Ronnell Freshwater, NP sent at 05/26/2018  8:46 AM EDT ----- Please let the patient know she had normal pap

## 2018-05-26 NOTE — Telephone Encounter (Signed)
CALLED PT ABOUT HER PAP SMEAR RESULTS.

## 2018-05-27 DIAGNOSIS — Z124 Encounter for screening for malignant neoplasm of cervix: Secondary | ICD-10-CM | POA: Insufficient documentation

## 2018-05-27 DIAGNOSIS — N390 Urinary tract infection, site not specified: Secondary | ICD-10-CM | POA: Insufficient documentation

## 2018-05-27 DIAGNOSIS — R300 Dysuria: Secondary | ICD-10-CM | POA: Insufficient documentation

## 2018-05-27 MED ORDER — NITROFURANTOIN MONOHYD MACRO 100 MG PO CAPS: 100.0000 mg | ORAL_CAPSULE | Freq: Two times a day (BID) | ORAL | 0 refills | Status: DC

## 2018-05-27 NOTE — Progress Notes (Signed)
PT WAS CALLED AND NOTIFIED

## 2018-08-28 ENCOUNTER — Other Ambulatory Visit: Payer: Self-pay | Admitting: Internal Medicine

## 2018-08-28 DIAGNOSIS — Z1231 Encounter for screening mammogram for malignant neoplasm of breast: Secondary | ICD-10-CM

## 2018-09-14 ENCOUNTER — Ambulatory Visit
Admission: RE | Admit: 2018-09-14 | Discharge: 2018-09-14 | Disposition: A | Discharge status: Home / Self Care | Payer: BLUE CROSS/BLUE SHIELD | Source: Ambulatory Visit | Attending: Internal Medicine | Admitting: Internal Medicine

## 2018-09-14 ENCOUNTER — Encounter (INDEPENDENT_AMBULATORY_CARE_PROVIDER_SITE_OTHER): Payer: Self-pay

## 2018-09-14 DIAGNOSIS — Z1231 Encounter for screening mammogram for malignant neoplasm of breast: Secondary | ICD-10-CM | POA: Diagnosis not present

## 2018-09-21 ENCOUNTER — Encounter: Payer: Self-pay | Admitting: Nurse Practitioner

## 2018-09-21 ENCOUNTER — Ambulatory Visit: Payer: BLUE CROSS/BLUE SHIELD | Admitting: Nurse Practitioner

## 2018-09-21 VITALS — BP 130/80 | HR 80 | Resp 16 | Ht 66.5 in | Wt 162.0 lb

## 2018-09-21 DIAGNOSIS — F988 Other specified behavioral and emotional disorders with onset usually occurring in childhood and adolescence: Secondary | ICD-10-CM | POA: Diagnosis not present

## 2018-09-21 DIAGNOSIS — F5101 Primary insomnia: Secondary | ICD-10-CM | POA: Diagnosis not present

## 2018-09-21 MED ORDER — ADDERALL XR 30 MG PO CP24: 30.0000 mg | ORAL_CAPSULE | Freq: Every day | ORAL | 0 refills | Status: DC

## 2018-09-21 NOTE — Progress Notes (Signed)
[facility] [place], [place] [address]  Internal MEDICINE  Office Visit Note  Patient Name: April Wilkins  [id number]  [id number]  Date of Service: 09/21/2018  Chief Complaint  Patient presents with  . Allergies    The patient is here for routine follow up visit. She admits she is having trouble sleeping recently. Works out for about an hour in the evenings and drinks a lot of water. Feels like this is contributing to her difficulty. She has bought some melatonin to help improve her sleep. Will start this tonight.  She does take Adderall XR 30mg  daily during her work week. This medication helps to keep her on track and focused while at work. She needs to have refills for this today.      Current Medication: Outpatient Encounter Medications as of 09/21/2018  Medication Sig  . acetaminophen (TYLENOL) 325 MG tablet Take by mouth.  . ADDERALL XR 30 MG 24 hr capsule Take 1 capsule (30 mg total) by mouth daily.  Marland Kitchen ibuprofen (ADVIL,MOTRIN) 200 MG tablet Take by mouth.  . [DISCONTINUED] ADDERALL XR 30 MG 24 hr capsule Take 1 capsule (30 mg total) by mouth daily.  . [DISCONTINUED] ADDERALL XR 30 MG 24 hr capsule Take 1 capsule (30 mg total) by mouth daily.  . [DISCONTINUED] ADDERALL XR 30 MG 24 hr capsule Take 1 capsule (30 mg total) by mouth daily.  . [DISCONTINUED] nitrofurantoin, macrocrystal-monohydrate, (MACROBID) 100 MG capsule Take 1 capsule (100 mg total) by mouth 2 (two) times daily.   No facility-administered encounter medications on file as of 09/21/2018.     Surgical History: Past Surgical History:  Procedure Laterality Date  . SKIN BIOPSY      Medical History: Past Medical History:  Diagnosis Date  . Allergy    Seasonal  . Chicken pox   . Frequent headaches   . Melanoma ([place]) 2010  . Migraines     Family History: Family History  Problem Relation Age of Onset  . Hyperlipidemia Father   . Heart disease Father   . Diabetes  Father   . Cancer Father        Skin Cancer  . Alcohol abuse Paternal Uncle   . Hyperlipidemia Paternal Uncle   . Heart disease Paternal Uncle   . Kidney disease Maternal Grandfather   . Hyperlipidemia Paternal Grandmother   . Heart disease Paternal Grandmother   . Drug abuse Paternal Grandmother   . Breast cancer Maternal Grandmother 70    Social History   Socioeconomic History  . Marital status: Single    Spouse name: Not on file  . Number of children: Not on file  . Years of education: Not on file  . Highest education level: Not on file  Occupational History  . Not on file  Social Needs  . Financial resource strain: Not on file  . Food insecurity:    Worry: Not on file    Inability: Not on file  . Transportation needs:    Medical: Not on file    Non-medical: Not on file  Tobacco Use  . Smoking status: Never Smoker  . Smokeless tobacco: Never Used  Substance and Sexual Activity  . Alcohol use: Yes    Alcohol/week: 0.0 standard drinks    Comment: Rare   . Drug use: No  . Sexual activity: Yes    Partners: Male  Lifestyle  . Physical activity:    Days per week: Not on file  Minutes per session: Not on file  . Stress: Not on file  Relationships  . Social connections:    Talks on phone: Not on file    Gets together: Not on file    Attends religious service: Not on file    Active member of club or organization: Not on file    Attends meetings of clubs or organizations: Not on file    Relationship status: Not on file  . Intimate partner violence:    Fear of current or ex partner: Not on file    Emotionally abused: Not on file    Physically abused: Not on file    Forced sexual activity: Not on file  Other Topics Concern  . Not on file  Social History Narrative   Divorced   Goes by [place]    2 children   Caffeine- No coffee, tea and soda daily 24 oz.        Review of Systems  Constitutional: Negative for  activity change, chills, fatigue and unexpected weight change.  HENT: Negative for congestion, postnasal drip, rhinorrhea, sneezing and sore throat.   Respiratory: Negative for cough, chest tightness, shortness of breath and wheezing.   Cardiovascular: Negative for chest pain and palpitations.  Gastrointestinal: Negative for abdominal pain, constipation, diarrhea, nausea and vomiting.  Musculoskeletal: Negative for arthralgias, back pain, joint swelling and neck pain.  Skin: Negative for rash.  Neurological: Negative for dizziness, tremors, numbness and headaches.  Hematological: Negative for adenopathy. Does not bruise/bleed easily.  Psychiatric/Behavioral: Positive for decreased concentration and sleep disturbance. Negative for behavioral problems (Depression) and suicidal ideas. The patient is not nervous/anxious.     Vital Signs: BP 130/80 (BP Location: Left Arm, Patient Position: Sitting, Cuff Size: Normal)   Pulse 80   Resp 16   Ht 5' 6.5" (1.689 m)   Wt 162 lb (73.5 kg)   SpO2 98%   BMI 25.76 kg/m    Physical Exam  Constitutional: She is oriented to person, place, and time. She appears well-developed and well-nourished. No distress.  HENT:  Head: Normocephalic and atraumatic.  Mouth/Throat: No oropharyngeal exudate.  Eyes: Pupils are equal, round, and reactive to light. EOM are normal.  Cardiovascular: Normal rate, regular rhythm and normal heart sounds. Exam reveals no gallop and no friction rub.  No murmur heard. Pulmonary/Chest: Effort normal and breath sounds normal. No respiratory distress. She has no wheezes. She has no rales. She exhibits no tenderness.  Musculoskeletal: Normal range of motion.  Neurological: She is alert and oriented to person, place, and time. No cranial nerve deficit.  Skin: Skin is warm and dry. She is not diaphoretic.  Psychiatric: She has a normal mood and affect. Her behavior is normal. Judgment and thought content normal.  Nursing note and  vitals reviewed.  Assessment/Plan: 1. Primary insomnia Recommend she start melatonin at bedtime as needed. Good sleep hygiene was discussed.   2. 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 Three 30 day prescriptions were provided. Dates are 09/21/2018, 10/19/2018, and 11/17/2018. - ADDERALL XR 30 MG 24 hr capsule; Take 1 capsule (30 mg total) by mouth daily.  Dispense: 30 capsule; Refill: 0  General Counseling: Vivianne verbalizes understanding of the findings of todays visit and agrees with plan of treatment. I have discussed any further diagnostic evaluation that may be needed or ordered today. We also reviewed her medications today. she has been encouraged  to call the office with any questions or concerns that should arise related to todays visit.  Refilled Controlled medications today. Reviewed risks and possible side effects associated with taking Stimulants. Combination of these drugs with other psychotropic medications could cause dizziness and drowsiness. Pt needs to Monitor symptoms and exercise caution in driving and operating heavy machinery to avoid damages to oneself, to others and to the surroundings. Patient verbalized understanding in this matter. Dependence and abuse for these drugs will be monitored closely. A Controlled substance policy and procedure is on file which allows [facility] to order a urine drug screen test at any visit. Patient understands and agrees with the plan..  This patient was seen by Leretha Pol FNP Collaboration with Dr Lavera Guise as a part of collaborative care agreement  Meds ordered this encounter  Medications  . DISCONTD: ADDERALL XR 30 MG 24 hr capsule    Sig: Take 1 capsule (30 mg total) by mouth daily.    Dispense:  30 capsule    Refill:  0    Order Specific Question:   Supervising Provider    Answer:   Lavera Guise [[id number]]  . DISCONTD: ADDERALL XR 30 MG 24 hr capsule    Sig: Take 1 capsule  (30 mg total) by mouth daily.    Dispense:  30 capsule    Refill:  0    Fill after 10/19/2018    Order Specific Question:   Supervising Provider    Answer:   Lavera Guise [[id number]]  . ADDERALL XR 30 MG 24 hr capsule    Sig: Take 1 capsule (30 mg total) by mouth daily.    Dispense:  30 capsule    Refill:  0    Fill after 11/17/2018    Order Specific Question:   Supervising Provider    Answer:   Lavera Guise [[id number]]    Time spent: 91 Minutes      Dr Lavera Guise Internal medicine

## 2018-10-07 ENCOUNTER — Encounter: Payer: Self-pay | Admitting: Nurse Practitioner

## 2018-11-06 ENCOUNTER — Telehealth: Payer: Self-pay

## 2018-11-06 ENCOUNTER — Other Ambulatory Visit: Payer: Self-pay

## 2018-11-06 ENCOUNTER — Other Ambulatory Visit: Payer: Self-pay | Admitting: Nurse Practitioner

## 2018-11-06 DIAGNOSIS — F988 Other specified behavioral and emotional disorders with onset usually occurring in childhood and adolescence: Secondary | ICD-10-CM

## 2018-11-06 MED ORDER — ADDERALL XR 30 MG PO CP24: 30.0000 mg | ORAL_CAPSULE | Freq: Every day | ORAL | 0 refills | Status: DC

## 2018-11-06 NOTE — Telephone Encounter (Signed)
Two new prescriptions for Adderall XR 30mg  capsules sent to her pharmacy. First dated today. Second dated 12/05/2018. Both sent to [facility] in [place].

## 2018-11-06 NOTE — Telephone Encounter (Signed)
Pt advised we send  med to [facility]  

## 2018-11-06 NOTE — Progress Notes (Signed)
Two new prescriptions for Adderall XR 30mg  capsules sent to her pharmacy. First dated today. Second dated 12/05/2018. Both sent to [facility] in [place].

## 2019-01-21 ENCOUNTER — Ambulatory Visit: Payer: BC Managed Care – PPO | Admitting: Nurse Practitioner

## 2019-01-21 ENCOUNTER — Telehealth: Payer: Self-pay

## 2019-01-21 ENCOUNTER — Encounter: Payer: Self-pay | Admitting: Nurse Practitioner

## 2019-01-21 DIAGNOSIS — F988 Other specified behavioral and emotional disorders with onset usually occurring in childhood and adolescence: Secondary | ICD-10-CM

## 2019-01-21 MED ORDER — ADDERALL XR 30 MG PO CP24: 30.0000 mg | ORAL_CAPSULE | Freq: Every day | ORAL | 0 refills | Status: DC

## 2019-01-21 MED ORDER — AMPHETAMINE-DEXTROAMPHETAMINE 10 MG PO TABS: ORAL_TABLET | ORAL | 0 refills | Status: DC

## 2019-01-21 NOTE — Telephone Encounter (Signed)
I changed this back to Adderall; XR 30mg  daily. Sent three 30 day prescriptions to the [facility] in [place]. I did add a 10mg  tablet to take in afternoon if needed. Only sent one 30 day prescription for this. Should contact the office if she needs this beyond the next 30 days.

## 2019-01-21 NOTE — Progress Notes (Signed)
[facility] [place], [place] [address]  Internal MEDICINE  Office Visit Note  Patient Name: April Wilkins  [id number]  [id number]  Date of Service: 02/10/2019  Chief Complaint  Patient presents with  . Follow-up    Med refills   . Migraine    The patient is here for follow regarding her medication. Currently takes adderall XR 20mg  daily. States that she has been having some issues with concentration and focus, especially at the end of the days.       Current Medication: Outpatient Encounter Medications as of 01/21/2019  Medication Sig  . acetaminophen (TYLENOL) 325 MG tablet Take by mouth.  . ADDERALL XR 30 MG 24 hr capsule Take 1 capsule (30 mg total) by mouth daily.  Marland Kitchen ibuprofen (ADVIL,MOTRIN) 200 MG tablet Take by mouth.  . [DISCONTINUED] ADDERALL XR 30 MG 24 hr capsule Take 1 capsule (30 mg total) by mouth daily.  . [DISCONTINUED] ADDERALL XR 30 MG 24 hr capsule Take 1 capsule (30 mg total) by mouth daily.  . [DISCONTINUED] ADDERALL XR 30 MG 24 hr capsule Take 1 capsule (30 mg total) by mouth daily.  Marland Kitchen amphetamine-dextroamphetamine (ADDERALL) 10 MG tablet Take 1/2 to 1 tablet po in the afternoon if needed   No facility-administered encounter medications on file as of 01/21/2019.     Surgical History: Past Surgical History:  Procedure Laterality Date  . SKIN BIOPSY      Medical History: Past Medical History:  Diagnosis Date  . Allergy    Seasonal  . Chicken pox   . Frequent headaches   . Melanoma ([place]) 2010  . Migraines     Family History: Family History  Problem Relation Age of Onset  . Hyperlipidemia Father   . Heart disease Father   . Diabetes Father   . Cancer Father        Skin Cancer  . Alcohol abuse Paternal Uncle   . Hyperlipidemia Paternal Uncle   . Heart disease Paternal Uncle   . Kidney disease Maternal Grandfather   . Hyperlipidemia Paternal Grandmother   . Heart disease Paternal Grandmother   . Drug abuse  Paternal Grandmother   . Breast cancer Maternal Grandmother 70    Social History   Socioeconomic History  . Marital status: Single    Spouse name: Not on file  . Number of children: Not on file  . Years of education: Not on file  . Highest education level: Not on file  Occupational History  . Not on file  Social Needs  . Financial resource strain: Not on file  . Food insecurity:    Worry: Not on file    Inability: Not on file  . Transportation needs:    Medical: Not on file    Non-medical: Not on file  Tobacco Use  . Smoking status: Never Smoker  . Smokeless tobacco: Never Used  Substance and Sexual Activity  . Alcohol use: Yes    Alcohol/week: 0.0 standard drinks    Comment: Rare   . Drug use: No  . Sexual activity: Yes    Partners: Male  Lifestyle  . Physical activity:    Days per week: Not on file    Minutes per session: Not on file  . Stress: Not on file  Relationships  . Social connections:    Talks on phone: Not on file    Gets together: Not on file    Attends religious service: Not on file  Active member of club or organization: Not on file    Attends meetings of clubs or organizations: Not on file    Relationship status: Not on file  . Intimate partner violence:    Fear of current or ex partner: Not on file    Emotionally abused: Not on file    Physically abused: Not on file    Forced sexual activity: Not on file  Other Topics Concern  . Not on file  Social History Narrative   Divorced   Goes by [place]    2 children   Caffeine- No coffee, tea and soda daily 24 oz.        Review of Systems  Constitutional: Negative for activity change, chills, fatigue and unexpected weight change.  HENT: Negative for congestion, postnasal drip, rhinorrhea, sneezing and sore throat.   Respiratory: Negative for cough, chest tightness, shortness of breath and wheezing.   Cardiovascular: Negative for chest pain and  palpitations.  Gastrointestinal: Negative for abdominal pain, constipation, diarrhea, nausea and vomiting.  Musculoskeletal: Negative for arthralgias, back pain, joint swelling and neck pain.  Skin: Negative for rash.  Neurological: Negative for dizziness, tremors, numbness and headaches.  Hematological: Negative for adenopathy. Does not bruise/bleed easily.  Psychiatric/Behavioral: Positive for decreased concentration and sleep disturbance. Negative for behavioral problems (Depression) and suicidal ideas. The patient is not nervous/anxious.        Worsening problems with concentration since increased concern for spread of COVID 19.     Today's Vitals   01/21/19 1137  BP: 120/80  Pulse: 72  Resp: 16  SpO2: 98%  Weight: 163 lb (73.9 kg)  Height: 5\' 7"  (1.702 m)   Body mass index is 25.53 kg/m.   Physical Exam Vitals signs and nursing note reviewed.  Constitutional:      General: She is not in acute distress.    Appearance: Normal appearance. She is well-developed. She is not diaphoretic.  HENT:     Head: Normocephalic and atraumatic.     Mouth/Throat:     Pharynx: No oropharyngeal exudate.  Eyes:     Pupils: Pupils are equal, round, and reactive to light.  Cardiovascular:     Rate and Rhythm: Normal rate and regular rhythm.     Heart sounds: Normal heart sounds. No murmur. No friction rub. No gallop.   Pulmonary:     Effort: Pulmonary effort is normal. No respiratory distress.     Breath sounds: Normal breath sounds. No wheezing or rales.  Chest:     Chest wall: No tenderness.  Musculoskeletal: Normal range of motion.  Skin:    General: Skin is warm and dry.  Neurological:     Mental Status: She is alert and oriented to person, place, and time.     Cranial Nerves: No cranial nerve deficit.  Psychiatric:        Behavior: Behavior normal.        Thought Content: Thought content normal.        Judgment: Judgment normal.    Assessment/Plan: 1. Attention deficit  disorder (ADD) in adult Worsening with concern of spread of COVID 19. Will continue Adderall XR 30mg  daily as needed. Three 30 day prescriptions were sent to her pharmacy. Dates are 312/2020, 02/19/2019, and 03/19/2019. Add adderall 10mg  tablet, taking 1/2 to 1 tablet in the afternoon only when needed. Single 30 day prescription sent to her pharmacy. Will readdress in one month and determine need  for continuing this.  - ADDERALL XR 30 MG 24 hr capsule; Take 1 capsule (30 mg total) by mouth daily.  Dispense: 30 capsule; Refill: 0 - amphetamine-dextroamphetamine (ADDERALL) 10 MG tablet; Take 1/2 to 1 tablet po in the afternoon if needed  Dispense: 30 tablet; Refill: 0  General Counseling: Mikhia verbalizes understanding of the findings of todays visit and agrees with plan of treatment. I have discussed any further diagnostic evaluation that may be needed or ordered today. We also reviewed her medications today. she has been encouraged to call the office with any questions or concerns that should arise related to todays visit.    Refilled Controlled medications today. Reviewed risks and possible side effects associated with taking Stimulants. Combination of these drugs with other psychotropic medications could cause dizziness and drowsiness. Pt needs to Monitor symptoms and exercise caution in driving and operating heavy machinery to avoid damages to oneself, to others and to the surroundings. Patient verbalized understanding in this matter. Dependence and abuse for these drugs will be monitored closely. A Controlled substance policy and procedure is on file which allows [facility] to order a urine drug screen test at any visit. Patient understands and agrees with the plan..  This patient was seen by Leretha Pol FNP Collaboration with Dr Lavera Guise as a part of collaborative care agreement  Meds ordered this encounter  Medications  . DISCONTD: ADDERALL XR 30 MG 24 hr capsule    Sig: Take 1  capsule (30 mg total) by mouth daily.    Dispense:  30 capsule    Refill:  0    Order Specific Question:   Supervising Provider    Answer:   Lavera Guise [[id number]]  . DISCONTD: ADDERALL XR 30 MG 24 hr capsule    Sig: Take 1 capsule (30 mg total) by mouth daily.    Dispense:  30 capsule    Refill:  0    Fill after 02/19/2019    Order Specific Question:   Supervising Provider    Answer:   Lavera Guise [[id number]]  . ADDERALL XR 30 MG 24 hr capsule    Sig: Take 1 capsule (30 mg total) by mouth daily.    Dispense:  30 capsule    Refill:  0    Fill after 03/19/2019    Order Specific Question:   Supervising Provider    Answer:   Lavera Guise [[id number]]  . amphetamine-dextroamphetamine (ADDERALL) 10 MG tablet    Sig: Take 1/2 to 1 tablet po in the afternoon if needed    Dispense:  30 tablet    Refill:  0    Order Specific Question:   Supervising Provider    Answer:   Lavera Guise [[id number]]    Time spent: 81 Minutes      Dr Lavera Guise Internal medicine

## 2019-01-22 NOTE — Telephone Encounter (Signed)
Pt advised we send pres  

## 2019-04-23 ENCOUNTER — Ambulatory Visit: Payer: BC Managed Care – PPO | Admitting: Nurse Practitioner

## 2019-04-23 ENCOUNTER — Other Ambulatory Visit: Payer: Self-pay

## 2019-04-23 ENCOUNTER — Encounter: Payer: Self-pay | Admitting: Nurse Practitioner

## 2019-04-23 VITALS — BP 142/86 | HR 74 | Resp 16 | Ht 66.5 in | Wt 163.5 lb

## 2019-04-23 DIAGNOSIS — Z79899 Other long term (current) drug therapy: Secondary | ICD-10-CM | POA: Diagnosis not present

## 2019-04-23 DIAGNOSIS — F988 Other specified behavioral and emotional disorders with onset usually occurring in childhood and adolescence: Secondary | ICD-10-CM

## 2019-04-23 LAB — POCT URINE DRUG SCREEN
POC Amphetamine UR: POSITIVE — AB
POC BENZODIAZEPINES UR: NOT DETECTED
POC Barbiturate UR: NOT DETECTED
POC Cocaine UR: NOT DETECTED
POC Ecstasy UR: NOT DETECTED
POC Marijuana UR: NOT DETECTED
POC Methadone UR: NOT DETECTED
POC Methamphetamine UR: NOT DETECTED
POC Opiate Ur: NOT DETECTED
POC Oxycodone UR: NOT DETECTED
POC PHENCYCLIDINE UR: NOT DETECTED
POC TRICYCLICS UR: NOT DETECTED

## 2019-04-23 MED ORDER — ADDERALL XR 30 MG PO CP24: 30.0000 mg | ORAL_CAPSULE | Freq: Every day | ORAL | 0 refills | Status: DC

## 2019-04-23 MED ORDER — AMPHETAMINE-DEXTROAMPHETAMINE 10 MG PO TABS: ORAL_TABLET | ORAL | 0 refills | Status: DC

## 2019-04-23 NOTE — Progress Notes (Signed)
[facility] [place], [place] [address]  Internal MEDICINE  Office Visit Note  Patient Name: April Wilkins  [id number]  [id number]  Date of Service: 04/23/2019  Chief Complaint  Patient presents with  . Medical Management of Chronic Issues    3 month follow up, medication refill  . Allergies  . Migraine    The patient is here for follow up visit. She is currently taking adderall  XR 30mg  in the mornings and we did add adderall 10mg  prn in afternoons. She states that on very long days, this has helped out a lot. 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 She has not experienced any negative side effects from this change in medication. She is due to have refills today.       Current Medication: Outpatient Encounter Medications as of 04/23/2019  Medication Sig  . acetaminophen (TYLENOL) 325 MG tablet Take by mouth.  . ADDERALL XR 30 MG 24 hr capsule Take 1 capsule (30 mg total) by mouth daily.  Marland Kitchen amphetamine-dextroamphetamine (ADDERALL) 10 MG tablet Take 1/2 to 1 tablet po in the afternoon if needed  . ibuprofen (ADVIL,MOTRIN) 200 MG tablet Take by mouth.  . [DISCONTINUED] ADDERALL XR 30 MG 24 hr capsule Take 1 capsule (30 mg total) by mouth daily.  . [DISCONTINUED] ADDERALL XR 30 MG 24 hr capsule Take 1 capsule (30 mg total) by mouth daily.  . [DISCONTINUED] ADDERALL XR 30 MG 24 hr capsule Take 1 capsule (30 mg total) by mouth daily.  . [DISCONTINUED] amphetamine-dextroamphetamine (ADDERALL) 10 MG tablet Take 1/2 to 1 tablet po in the afternoon if needed  . [DISCONTINUED] amphetamine-dextroamphetamine (ADDERALL) 10 MG tablet Take 1/2 to 1 tablet po in the afternoon if needed  . [DISCONTINUED] amphetamine-dextroamphetamine (ADDERALL) 10 MG tablet Take 1/2 to 1 tablet po in the afternoon if needed   No facility-administered encounter medications on file as of 04/23/2019.      Surgical History: Past Surgical History:  Procedure Laterality Date  . SKIN BIOPSY      Medical History: Past Medical History:  Diagnosis Date  . Allergy    Seasonal  . Chicken pox   . Frequent headaches   . Melanoma ([place]) 2010  . Migraines     Family History: Family History  Problem Relation Age of Onset  . Hyperlipidemia Father   . Heart disease Father   . Diabetes Father   . Cancer Father        Skin Cancer  . Alcohol abuse Paternal Uncle   . Hyperlipidemia Paternal Uncle   . Heart disease Paternal Uncle   . Kidney disease Maternal Grandfather   . Hyperlipidemia Paternal Grandmother   . Heart disease Paternal Grandmother   . Drug abuse Paternal Grandmother   . Breast cancer Maternal Grandmother 70    Social History   Socioeconomic History  . Marital status: Single    Spouse name: Not on file  . Number of children: Not on file  . Years of education: Not on file  . Highest education level: Not on file  Occupational History  . Not on file  Social Needs  . Financial resource strain: Not on file  . Food insecurity    Worry: Not on file    Inability: Not on file  . Transportation needs    Medical: Not on file    Non-medical: Not on file  Tobacco Use  . Smoking status: Never Smoker  . Smokeless tobacco: Never Used  Substance and Sexual Activity  . Alcohol use: Yes    Alcohol/week: 0.0 standard drinks    Comment: Rare   . Drug use: No  . Sexual activity: Yes    Partners: Male  Lifestyle  . Physical activity    Days per week: Not on file    Minutes per session: Not on file  . Stress: Not on file  Relationships  . Social Herbalist on phone: Not on file    Gets together: Not on file    Attends religious service: Not on file    Active member of club or organization: Not on file    Attends meetings of clubs or organizations: Not on file    Relationship status: Not on file  . Intimate partner violence    Fear of current or ex partner:  Not on file    Emotionally abused: Not on file    Physically abused: Not on file    Forced sexual activity: Not on file  Other Topics Concern  . Not on file  Social History Narrative   Divorced   Goes by [place]    2 children   Caffeine- No coffee, tea and soda daily 24 oz.        Review of Systems  Constitutional: Positive for fatigue. Negative for activity change, chills and unexpected weight change.  HENT: Negative for congestion, postnasal drip, rhinorrhea, sneezing and sore throat.   Respiratory: Negative for cough, chest tightness, shortness of breath and wheezing.   Cardiovascular: Negative for chest pain and palpitations.  Gastrointestinal: Negative for abdominal pain, constipation, diarrhea, nausea and vomiting.  Endocrine: Negative for cold intolerance, heat intolerance, polydipsia and polyuria.  Musculoskeletal: Negative for arthralgias, back pain, joint swelling and neck pain.  Skin: Negative for rash.  Neurological: Negative for dizziness, tremors, numbness and headaches.  Hematological: Negative for adenopathy. Does not bruise/bleed easily.  Psychiatric/Behavioral: Positive for decreased concentration and sleep disturbance. Negative for behavioral problems (Depression) and suicidal ideas. The patient is not nervous/anxious.        Worsening problems with concentration since increased concern for spread of COVID 19.     Today's Vitals   04/23/19 0959  BP: (!) 142/86  Pulse: 74  Resp: 16  SpO2: 100%  Weight: 163 lb 8 oz (74.2 kg)  Height: 5' 6.5" (1.689 m)   Body mass index is 25.99 kg/m.  Physical Exam Vitals signs and nursing note reviewed.  Constitutional:      General: She is not in acute distress.    Appearance: Normal appearance. She is well-developed. She is not diaphoretic.  HENT:     Head: Normocephalic and atraumatic.     Mouth/Throat:     Pharynx: No oropharyngeal exudate.  Eyes:     Pupils: Pupils  are equal, round, and reactive to light.  Cardiovascular:     Rate and Rhythm: Normal rate and regular rhythm.     Heart sounds: Normal heart sounds. No murmur. No friction rub. No gallop.   Pulmonary:     Effort: Pulmonary effort is normal. No respiratory distress.     Breath sounds: Normal breath sounds. No wheezing or rales.  Chest:     Chest wall: No tenderness.  Musculoskeletal: Normal range of motion.  Skin:    General: Skin is warm and dry.  Neurological:  Mental Status: She is alert and oriented to person, place, and time.     Cranial Nerves: No cranial nerve deficit.  Psychiatric:        Behavior: Behavior normal.        Thought Content: Thought content normal.        Judgment: Judgment normal.    Assessment/Plan: 1. Attention deficit disorder (ADD) in adult Patient doing better with new dosing regimen with adderall XR 30mg  in morning and Adderall 10mg  in the afternoons as needed. Three 30 day prescriptions provided for both. Dates are 04/23/2019, 05/21/2019, and 06/19/2019 - ADDERALL XR 30 MG 24 hr capsule; Take 1 capsule (30 mg total) by mouth daily.  Dispense: 30 capsule; Refill: 0 - amphetamine-dextroamphetamine (ADDERALL) 10 MG tablet; Take 1/2 to 1 tablet po in the afternoon if needed  Dispense: 30 tablet; Refill: 0  2. Encounter for long-term (current) use of medications - POCT Urine Drug Screen appropriately positive for AMP only.   General Counseling: April Wilkins understanding of the findings of todays visit and agrees with plan of treatment. I have discussed any further diagnostic evaluation that may be needed or ordered today. We also reviewed her medications today. she has been encouraged to call the office with any questions or concerns that should arise related to todays visit.  Refilled Controlled medications today. Reviewed risks and possible side effects associated with taking Stimulants. Combination of these drugs with other psychotropic medications  could cause dizziness and drowsiness. Pt needs to Monitor symptoms and exercise caution in driving and operating heavy machinery to avoid damages to oneself, to others and to the surroundings. Patient verbalized understanding in this matter. Dependence and abuse for these drugs will be monitored closely. A Controlled substance policy and procedure is on file which allows [facility] to order a urine drug screen test at any visit. Patient understands and agrees with the plan..  This patient was seen by Leretha Pol FNP Collaboration with Dr Lavera Guise as a part of collaborative care agreement  Orders Placed This Encounter  Procedures  . POCT Urine Drug Screen    Meds ordered this encounter  Medications  . DISCONTD: ADDERALL XR 30 MG 24 hr capsule    Sig: Take 1 capsule (30 mg total) by mouth daily.    Dispense:  30 capsule    Refill:  0    Order Specific Question:   Supervising Provider    Answer:   Lavera Guise [[id number]]  . DISCONTD: amphetamine-dextroamphetamine (ADDERALL) 10 MG tablet    Sig: Take 1/2 to 1 tablet po in the afternoon if needed    Dispense:  30 tablet    Refill:  0    Order Specific Question:   Supervising Provider    Answer:   Lavera Guise [place]  . DISCONTD: ADDERALL XR 30 MG 24 hr capsule    Sig: Take 1 capsule (30 mg total) by mouth daily.    Dispense:  30 capsule    Refill:  0    Fill after 05/21/2019    Order Specific Question:   Supervising Provider    Answer:   Lavera Guise [[id number]]  . DISCONTD: amphetamine-dextroamphetamine (ADDERALL) 10 MG tablet    Sig: Take 1/2 to 1 tablet po in the afternoon if needed    Dispense:  30 tablet    Refill:  0    Fill after 05/21/2019    Order Specific Question:   Supervising Provider    Answer:  KHAN, FOZIA M [[id number]]  . ADDERALL XR 30 MG 24 hr capsule    Sig: Take 1 capsule (30 mg total) by mouth daily.    Dispense:  30 capsule    Refill:  0    Fill after 06/19/2019    Order Specific Question:    Supervising Provider    Answer:   Lavera Guise [[id number]]  . amphetamine-dextroamphetamine (ADDERALL) 10 MG tablet    Sig: Take 1/2 to 1 tablet po in the afternoon if needed    Dispense:  30 tablet    Refill:  0    Fill after 06/19/2019    Order Specific Question:   Supervising Provider    Answer:   Lavera Guise [[id number]]    Time spent: 25 Minutes      Dr Lavera Guise Internal medicine

## 2019-04-23 NOTE — Progress Notes (Signed)
Pt blood pressure elevated,  1st reading 152/104 2nd reading 142/86 Informed provider

## 2019-07-27 ENCOUNTER — Encounter: Payer: Self-pay | Admitting: Nurse Practitioner

## 2019-07-27 ENCOUNTER — Other Ambulatory Visit: Payer: Self-pay

## 2019-07-27 ENCOUNTER — Ambulatory Visit: Payer: BC Managed Care – PPO | Admitting: Nurse Practitioner

## 2019-07-27 VITALS — Temp 96.0°F | Ht 66.5 in | Wt 164.0 lb

## 2019-07-27 DIAGNOSIS — R059 Cough, unspecified: Secondary | ICD-10-CM

## 2019-07-27 DIAGNOSIS — F988 Other specified behavioral and emotional disorders with onset usually occurring in childhood and adolescence: Secondary | ICD-10-CM

## 2019-07-27 DIAGNOSIS — J0140 Acute pansinusitis, unspecified: Secondary | ICD-10-CM

## 2019-07-27 DIAGNOSIS — R05 Cough: Secondary | ICD-10-CM | POA: Diagnosis not present

## 2019-07-27 MED ORDER — ADDERALL XR 30 MG PO CP24: 30.0000 mg | ORAL_CAPSULE | Freq: Every day | ORAL | 0 refills | Status: DC

## 2019-07-27 MED ORDER — HYDROCOD POLST-CPM POLST ER 10-8 MG/5ML PO SUER: 5.0000 mL | Freq: Two times a day (BID) | ORAL | 0 refills | Status: DC | PRN

## 2019-07-27 MED ORDER — AMOXICILLIN-POT CLAVULANATE 875-125 MG PO TABS: 1.0000 | ORAL_TABLET | Freq: Two times a day (BID) | ORAL | 0 refills | Status: DC

## 2019-07-27 NOTE — Progress Notes (Signed)
[facility] [place], [place] [address]  Internal MEDICINE  Telephone Visit  Patient Name: April Wilkins  [id number]  [id number]  Date of Service: 08/05/2019  I connected with the patient at 2:15pm by webcam and verified the patients identity using two identifiers.   I discussed the limitations, risks, security and privacy concerns of performing an evaluation and management service by webcam and the availability of in person appointments. I also discussed with the patient that there may be a patient responsible charge related to the service.  The patient expressed understanding and agrees to proceed.    Chief Complaint  Patient presents with  . Telephone Screen  . Telephone Assessment  . Sore Throat  . Cough    The patient has been contacted via webcam for follow up visit due to concerns for spread of novel coronavirus. The patient presents for follow up visit. She feels like she has a sinus infection. Her nose is very congested. Her nose is sore, she has headache, and has cough. She denies fever. Cough is dry, non-productive. She states that she has had symptoms since Saturday. She denies nausea and vomiting. Has had a little diarrhea. She denies exposure to anyone with COVID 19 to her knowledge.  She is currently taking adderall  XR 30mg  in the mornings. She states that she is not taking 10mg  dose in the afternons any longer. Doing well with single, long acting dose.       Current Medication: Outpatient Encounter Medications as of 07/27/2019  Medication Sig  . acetaminophen (TYLENOL) 325 MG tablet Take by mouth.  . ADDERALL XR 30 MG 24 hr capsule Take 1 capsule (30 mg total) by mouth daily.  Marland Kitchen amphetamine-dextroamphetamine (ADDERALL) 10 MG tablet Take 1/2 to 1 tablet po in the afternoon if needed  . ibuprofen (ADVIL,MOTRIN) 200 MG tablet Take by mouth.  . [DISCONTINUED] ADDERALL XR 30 MG 24 hr capsule Take 1 capsule (30 mg total) by mouth daily.  .  [DISCONTINUED] ADDERALL XR 30 MG 24 hr capsule Take 1 capsule (30 mg total) by mouth daily.  Marland Kitchen amoxicillin-clavulanate (AUGMENTIN) 875-125 MG tablet Take 1 tablet by mouth 2 (two) times daily.  . chlorpheniramine-HYDROcodone (TUSSIONEX PENNKINETIC ER) 10-8 MG/5ML SUER Take 5 mLs by mouth every 12 (twelve) hours as needed for cough.   No facility-administered encounter medications on file as of 07/27/2019.     Surgical History: Past Surgical History:  Procedure Laterality Date  . SKIN BIOPSY      Medical History: Past Medical History:  Diagnosis Date  . Allergy    Seasonal  . Chicken pox   . Frequent headaches   . Melanoma ([place]) 2010  . Migraines     Family History: Family History  Problem Relation Age of Onset  . Hyperlipidemia Father   . Heart disease Father   . Diabetes Father   . Cancer Father        Skin Cancer  . Alcohol abuse Paternal Uncle   . Hyperlipidemia Paternal Uncle   . Heart disease Paternal Uncle   . Kidney disease Maternal Grandfather   . Hyperlipidemia Paternal Grandmother   . Heart disease Paternal Grandmother   . Drug abuse Paternal Grandmother   . Breast cancer Maternal Grandmother 70    Social History   Socioeconomic History  . Marital status: Single    Spouse name: Not on file  . Number of children: Not on file  . Years of education: Not on file  .  Highest education level: Not on file  Occupational History  . Not on file  Social Needs  . Financial resource strain: Not on file  . Food insecurity    Worry: Not on file    Inability: Not on file  . Transportation needs    Medical: Not on file    Non-medical: Not on file  Tobacco Use  . Smoking status: Never Smoker  . Smokeless tobacco: Never Used  Substance and Sexual Activity  . Alcohol use: Yes    Alcohol/week: 0.0 standard drinks    Comment: Rare   . Drug use: No  . Sexual activity: Yes    Partners: Male  Lifestyle  . Physical activity    Days per week: Not on file     Minutes per session: Not on file  . Stress: Not on file  Relationships  . Social Herbalist on phone: Not on file    Gets together: Not on file    Attends religious service: Not on file    Active member of club or organization: Not on file    Attends meetings of clubs or organizations: Not on file    Relationship status: Not on file  . Intimate partner violence    Fear of current or ex partner: Not on file    Emotionally abused: Not on file    Physically abused: Not on file    Forced sexual activity: Not on file  Other Topics Concern  . Not on file  Social History Narrative   Divorced   Goes by [employer]    2 children   Caffeine- No coffee, tea and soda daily 24 oz.        Review of Systems  Constitutional: Positive for fatigue. Negative for activity change, chills and unexpected weight change.  HENT: Positive for congestion, postnasal drip, rhinorrhea and sore throat. Negative for sneezing.   Respiratory: Positive for cough. Negative for chest tightness, shortness of breath and wheezing.   Cardiovascular: Negative for chest pain and palpitations.  Gastrointestinal: Negative for abdominal pain, constipation, diarrhea, nausea and vomiting.  Endocrine: Negative for cold intolerance, heat intolerance, polydipsia and polyuria.  Musculoskeletal: Negative for arthralgias, back pain, joint swelling and neck pain.  Skin: Negative for rash.  Allergic/Immunologic: Negative for environmental allergies.  Neurological: Positive for headaches. Negative for dizziness, tremors and numbness.  Hematological: Positive for adenopathy. Does not bruise/bleed easily.  Psychiatric/Behavioral: Positive for decreased concentration and sleep disturbance. Negative for behavioral problems (Depression) and suicidal ideas. The patient is not nervous/anxious.        Doing better with concentration recently. States that she does not need refill of her IR  adderall.     Today's Vitals   07/27/19 1347  Temp: (!) 96 F (35.6 C)  Weight: 164 lb (74.4 kg)  Height: 5' 6.5" (1.689 m)   Body mass index is 26.07 kg/m.  Observation/Objective:   The patient is alert and oriented. She is pleasant and answers all questions appropriately. Breathing is non-labored. She is in no acute distress at this time. The patient is nasally congested and appears to feel poorly today.    Assessment/Plan: 1. Acute non-recurrent pansinusitis Start augmentin 875mg  twice daily for 10 days. Rest and increase fluids. Recommend use of OTC medication to alleviate acute symptoms.  - amoxicillin-clavulanate (AUGMENTIN) 875-125 MG tablet; Take 1 tablet by mouth 2 (two) times daily.  Dispense: 20 tablet; Refill: 0  2. Cough May take tussionex twice daily as needed for cough. Advised patient not to overuse this medicine and not to mix with other medications or alcohol as it can cause respiratory distress, sleepiness or dizziness. Should also avoid driving. Patient voiced understanding and agreement.  - chlorpheniramine-HYDROcodone (TUSSIONEX PENNKINETIC ER) 10-8 MG/5ML SUER; Take 5 mLs by mouth every 12 (twelve) hours as needed for cough.  Dispense: 115 mL; Refill: 0  3. Attention deficit disorder (ADD) in adult May continue to take adderall XR 30mg  daily for focus and concentration. Three 30 day prewscriptions provided to her pharmacy. Dates are 07/27/2019, 08/24/2019, and 09/22/2019 - ADDERALL XR 30 MG 24 hr capsule; Take 1 capsule (30 mg total) by mouth daily.  Dispense: 30 capsule; Refill: 0  General Counseling: Jisha verbalizes understanding of the findings of today's phone visit and agrees with plan of treatment. I have discussed any further diagnostic evaluation that may be needed or ordered today. We also reviewed her medications today. she has been encouraged to call the office with any questions or concerns that should arise related to todays visit.  Refilled  Controlled medications today. Reviewed risks and possible side effects associated with taking Stimulants. Combination of these drugs with other psychotropic medications could cause dizziness and drowsiness. Pt needs to Monitor symptoms and exercise caution in driving and operating heavy machinery to avoid damages to oneself, to others and to the surroundings. Patient verbalized understanding in this matter. Dependence and abuse for these drugs will be monitored closely. A Controlled substance policy and procedure is on file which allows [facility] to order a urine drug screen test at any visit. Patient understands and agrees with the plan..  This patient was seen by Leretha Pol FNP Collaboration with Dr Lavera Guise as a part of collaborative care agreement  Meds ordered this encounter  Medications  . amoxicillin-clavulanate (AUGMENTIN) 875-125 MG tablet    Sig: Take 1 tablet by mouth 2 (two) times daily.    Dispense:  20 tablet    Refill:  0    Order Specific Question:   Supervising Provider    Answer:   Lavera Guise [id number]  . chlorpheniramine-HYDROcodone (TUSSIONEX PENNKINETIC ER) 10-8 MG/5ML SUER    Sig: Take 5 mLs by mouth every 12 (twelve) hours as needed for cough.    Dispense:  115 mL    Refill:  0    Order Specific Question:   Supervising Provider    Answer:   Lavera Guise [id number]  . DISCONTD: ADDERALL XR 30 MG 24 hr capsule    Sig: Take 1 capsule (30 mg total) by mouth daily.    Dispense:  30 capsule    Refill:  0    Order Specific Question:   Supervising Provider    Answer:   Lavera Guise [id number]  . ADDERALL XR 30 MG 24 hr capsule    Sig: Take 1 capsule (30 mg total) by mouth daily.    Dispense:  30 capsule    Refill:  0    Fill after 08/24/2019    Order Specific Question:   Supervising Provider    Answer:   Lavera Guise [id number]    Time spent: 55 Minutes    Dr Lavera Guise Internal medicine

## 2019-08-05 DIAGNOSIS — R05 Cough: Secondary | ICD-10-CM | POA: Insufficient documentation

## 2019-08-05 DIAGNOSIS — R059 Cough, unspecified: Secondary | ICD-10-CM | POA: Insufficient documentation

## 2019-08-05 DIAGNOSIS — J0140 Acute pansinusitis, unspecified: Secondary | ICD-10-CM | POA: Insufficient documentation

## 2019-09-01 ENCOUNTER — Other Ambulatory Visit: Payer: Self-pay

## 2019-09-01 ENCOUNTER — Encounter: Payer: Self-pay | Admitting: Nurse Practitioner

## 2019-09-01 ENCOUNTER — Ambulatory Visit (INDEPENDENT_AMBULATORY_CARE_PROVIDER_SITE_OTHER): Payer: BC Managed Care – PPO | Admitting: Nurse Practitioner

## 2019-09-01 VITALS — BP 140/80 | HR 66 | Temp 97.2°F | Resp 16 | Ht 66.0 in | Wt 168.0 lb

## 2019-09-01 DIAGNOSIS — R300 Dysuria: Secondary | ICD-10-CM

## 2019-09-01 DIAGNOSIS — M545 Low back pain, unspecified: Secondary | ICD-10-CM

## 2019-09-01 DIAGNOSIS — Z0001 Encounter for general adult medical examination with abnormal findings: Secondary | ICD-10-CM

## 2019-09-01 DIAGNOSIS — F988 Other specified behavioral and emotional disorders with onset usually occurring in childhood and adolescence: Secondary | ICD-10-CM | POA: Diagnosis not present

## 2019-09-01 DIAGNOSIS — R5383 Other fatigue: Secondary | ICD-10-CM | POA: Diagnosis not present

## 2019-09-01 DIAGNOSIS — Z1231 Encounter for screening mammogram for malignant neoplasm of breast: Secondary | ICD-10-CM

## 2019-09-01 DIAGNOSIS — Z79899 Other long term (current) drug therapy: Secondary | ICD-10-CM

## 2019-09-01 LAB — POCT URINE DRUG SCREEN
POC Amphetamine UR: POSITIVE — AB
POC BENZODIAZEPINES UR: NOT DETECTED
POC Barbiturate UR: NOT DETECTED
POC Cocaine UR: NOT DETECTED
POC Ecstasy UR: NOT DETECTED
POC Marijuana UR: NOT DETECTED
POC Methadone UR: NOT DETECTED
POC Methamphetamine UR: NOT DETECTED
POC Opiate Ur: NOT DETECTED
POC Oxycodone UR: NOT DETECTED
POC PHENCYCLIDINE UR: NOT DETECTED
POC TRICYCLICS UR: NOT DETECTED

## 2019-09-01 MED ORDER — ADDERALL XR 30 MG PO CP24: 30.0000 mg | ORAL_CAPSULE | Freq: Every day | ORAL | 0 refills | Status: DC

## 2019-09-01 MED ORDER — MELOXICAM 7.5 MG PO TABS: 7.5000 mg | ORAL_TABLET | Freq: Every day | ORAL | 2 refills | Status: DC

## 2019-09-01 NOTE — Progress Notes (Signed)
[facility] [place], [place] [address]  Internal MEDICINE  Office Visit Note  Patient Name: April Wilkins  [id number]  [id number]  Date of Service: 09/01/2019   Pt is here for routine health maintenance examination   Chief Complaint  Patient presents with  . Annual Exam  . Allergies  . Migraine     The patient is here for health maintenance exam. She states that she has developed some lower back pain in the last few weeks. Is working from home and sits at a hard kitchen chair, rather than normal office chair. She states that she does use a cushion at times but not the one she is used to. She had surgery a while back to repair a herniated lumbar disc. She is concerned a bit about this happening again.  She is currently taking adderall  XR 30mg  in the mornings. She states that she is not taking 10mg  dose in the afternons any longer. Doing well with single, long acting dose.  She is due to have routine, fasting labs and mammogram. She is also due to have screening for colorectal cancer.   Current Medication: Outpatient Encounter Medications as of 09/01/2019  Medication Sig Note  . acetaminophen (TYLENOL) 325 MG tablet Take by mouth.   . ADDERALL XR 30 MG 24 hr capsule Take 1 capsule (30 mg total) by mouth daily.   Marland Kitchen ibuprofen (ADVIL,MOTRIN) 200 MG tablet Take by mouth.   . [DISCONTINUED] ADDERALL XR 30 MG 24 hr capsule Take 1 capsule (30 mg total) by mouth daily.   . [DISCONTINUED] ADDERALL XR 30 MG 24 hr capsule Take 1 capsule (30 mg total) by mouth daily.   . [DISCONTINUED] ADDERALL XR 30 MG 24 hr capsule Take 1 capsule (30 mg total) by mouth daily.   . [DISCONTINUED] amoxicillin-clavulanate (AUGMENTIN) 875-125 MG tablet Take 1 tablet by mouth 2 (two) times daily.   . [DISCONTINUED] amphetamine-dextroamphetamine (ADDERALL) 10 MG tablet Take 1/2 to 1 tablet po in the afternoon if needed 09/01/2019: patient states she no longer needs this medication   . [DISCONTINUED] chlorpheniramine-HYDROcodone (TUSSIONEX PENNKINETIC ER) 10-8 MG/5ML SUER Take 5 mLs by mouth every 12 (twelve) hours as needed for cough.   . meloxicam (MOBIC) 7.5 MG tablet Take 1 tablet (7.5 mg total) by mouth daily.    No facility-administered encounter medications on file as of 09/01/2019.     Surgical History: Past Surgical History:  Procedure Laterality Date  . SKIN BIOPSY      Medical History: Past Medical History:  Diagnosis Date  . Allergy    Seasonal  . Chicken pox   . Frequent headaches   . Melanoma ([place]) 2010  . Migraines     Family History: Family History  Problem Relation Age of Onset  . Hyperlipidemia Father   . Heart disease Father   . Diabetes Father   . Cancer Father        Skin Cancer  . Alcohol abuse Paternal Uncle   . Hyperlipidemia Paternal Uncle   . Heart disease Paternal Uncle   . Kidney disease Maternal Grandfather   . Hyperlipidemia Paternal Grandmother   . Heart disease Paternal Grandmother   . Drug abuse Paternal Grandmother   . Breast cancer Maternal Grandmother 70      Review of Systems  Constitutional: Negative for activity change, chills, fatigue and unexpected weight change.  HENT: Positive for sore throat. Negative for congestion, postnasal drip, rhinorrhea and sneezing.   Respiratory: Negative  for cough, chest tightness, shortness of breath and wheezing.   Cardiovascular: Negative for chest pain and palpitations.  Gastrointestinal: Negative for abdominal pain, constipation, diarrhea, nausea and vomiting.  Endocrine: Negative for cold intolerance, heat intolerance, polydipsia and polyuria.  Musculoskeletal: Positive for back pain and myalgias. Negative for arthralgias, joint swelling and neck pain.  Skin: Negative for rash.  Allergic/Immunologic: Negative for environmental allergies.  Neurological: Negative for dizziness, tremors, numbness and headaches.  Hematological: Positive for adenopathy. Does not  bruise/bleed easily.  Psychiatric/Behavioral: Positive for sleep disturbance. Negative for behavioral problems (Depression), decreased concentration and suicidal ideas. The patient is not nervous/anxious.        Doing better with concentration recently. States that she does not need refill of her IR adderall.     Today's Vitals   09/01/19 0853  BP: 140/80  Pulse: 66  Resp: 16  Temp: (!) 97.2 F (36.2 C)  SpO2: 99%  Weight: 168 lb (76.2 kg)  Height: 5\' 6"  (1.676 m)   Body mass index is 27.12 kg/m.  Physical Exam Vitals signs and nursing note reviewed.  Constitutional:      General: She is not in acute distress.    Appearance: Normal appearance. She is well-developed. She is not diaphoretic.  HENT:     Head: Normocephalic and atraumatic.     Mouth/Throat:     Pharynx: No oropharyngeal exudate.  Eyes:     Pupils: Pupils are equal, round, and reactive to light.  Neck:     Musculoskeletal: Normal range of motion and neck supple.     Vascular: No carotid bruit.  Cardiovascular:     Rate and Rhythm: Normal rate and regular rhythm.     Pulses: Normal pulses.     Heart sounds: Normal heart sounds. No murmur. No friction rub. No gallop.   Pulmonary:     Effort: Pulmonary effort is normal. No respiratory distress.     Breath sounds: Normal breath sounds. No wheezing or rales.  Chest:     Chest wall: No tenderness.     Breasts:        Right: Normal. No swelling, bleeding, inverted nipple, mass, nipple discharge, skin change or tenderness.        Left: Normal. No swelling, bleeding, inverted nipple, mass, nipple discharge, skin change or tenderness.  Abdominal:     General: Bowel sounds are normal.     Palpations: Abdomen is soft.  Musculoskeletal: Normal range of motion.  Lymphadenopathy:     Upper Body:     Right upper body: No supraclavicular or axillary adenopathy.     Left upper body: No supraclavicular or axillary adenopathy.  Skin:    General: Skin is warm and dry.   Neurological:     Mental Status: She is alert and oriented to person, place, and time.     Cranial Nerves: No cranial nerve deficit.  Psychiatric:        Behavior: Behavior normal.        Thought Content: Thought content normal.        Judgment: Judgment normal.        LABS: Recent Results (from the past 2160 hour(s))  POCT Urine Drug Screen     Status: Abnormal   Collection Time: 09/01/19  9:18 AM  Result Value Ref Range   POC METHAMPHETAMINE UR None Detected None Detected   POC Opiate Ur None Detected None Detected   POC Barbiturate UR None Detected None Detected   POC Amphetamine UR Positive (  A) None Detected   POC Oxycodone UR None Detected None Detected   POC Cocaine UR None Detected None Detected   POC Ecstasy UR None Detected None Detected   POC TRICYCLICS UR None Detected None Detected   POC PHENCYCLIDINE UR None Detected None Detected   POC MARIJUANA UR None Detected None Detected   POC METHADONE UR None Detected None Detected   POC BENZODIAZEPINES UR None Detected None Detected   URINE TEMPERATURE     POC DRUG SCREEN OXIDANTS URINE     POC SPECIFIC GRAVITY URINE     POC PH URINE     Methylenedioxyamphetamine     Assessment/Plan:    1. Encounter for general adult medical examination with abnormal findings Annual health maintenance exam today.  2. Acute midline low back pain without sciatica Add meloxicam 7.5mg  daily as needed for pain. Recommend heat as needed and use of sciatica pillow to help with low back pain.  - meloxicam (MOBIC) 7.5 MG tablet; Take 1 tablet (7.5 mg total) by mouth daily.  Dispense: 30 tablet; Refill: 2  3. Other fatigue Check routine, fasting labs, including thyroid panel   4. Attention deficit disorder (ADD) in adult May continue adderall XR 30mg  daily. Three 30day prescriptions were sent to her pharmacy. Dates are 09/01/2019, 09/30/2019, and 10/28/2019 - ADDERALL XR 30 MG 24 hr capsule; Take 1 capsule (30 mg total) by mouth daily.   Dispense: 30 capsule; Refill: 0  5. Encounter for screening mammogram for malignant neoplasm of breast - MM DIGITAL SCREENING BILATERAL; Future  6. Encounter for long-term (current) use of high-risk medication - POCT Urine Drug Screen appropriately positive for AMP only.   7. Dysuria - UA/M w/rflx Culture, Routine   General Counseling: Antonette verbalizes understanding of the findings of todays visit and agrees with plan of treatment. I have discussed any further diagnostic evaluation that may be needed or ordered today. We also reviewed her medications today. she has been encouraged to call the office with any questions or concerns that should arise related to todays visit.    Counseling:  Refilled Controlled medications today. Reviewed risks and possible side effects associated with taking Stimulants. Combination of these drugs with other psychotropic medications could cause dizziness and drowsiness. Pt needs to Monitor symptoms and exercise caution in driving and operating heavy machinery to avoid damages to oneself, to others and to the surroundings. Patient verbalized understanding in this matter. Dependence and abuse for these drugs will be monitored closely. A Controlled substance policy and procedure is on file which allows [facility] to order a urine drug screen test at any visit. Patient understands and agrees with the plan..  This patient was seen by Leretha Pol FNP Collaboration with Dr Lavera Guise as a part of collaborative care agreement  Orders Placed This Encounter  Procedures  . MM DIGITAL SCREENING BILATERAL  . UA/M w/rflx Culture, Routine  . POCT Urine Drug Screen    Meds ordered this encounter  Medications  . DISCONTD: ADDERALL XR 30 MG 24 hr capsule    Sig: Take 1 capsule (30 mg total) by mouth daily.    Dispense:  30 capsule    Refill:  0    Order Specific Question:   Supervising Provider    Answer:   Lavera Guise [id number]  . meloxicam (MOBIC)  7.5 MG tablet    Sig: Take 1 tablet (7.5 mg total) by mouth daily.    Dispense:  30 tablet  Refill:  2    Order Specific Question:   Supervising Provider    Answer:   Lavera Guise [id number]  . DISCONTD: ADDERALL XR 30 MG 24 hr capsule    Sig: Take 1 capsule (30 mg total) by mouth daily.    Dispense:  30 capsule    Refill:  0    Fill after 09/30/2019    Order Specific Question:   Supervising Provider    Answer:   Lavera Guise [id number]  . ADDERALL XR 30 MG 24 hr capsule    Sig: Take 1 capsule (30 mg total) by mouth daily.    Dispense:  30 capsule    Refill:  0    Fill after 10/28/2019    Order Specific Question:   Supervising Provider    Answer:   Lavera Guise [id number]    Time spent: [place], MD  Internal Medicine

## 2019-09-02 ENCOUNTER — Other Ambulatory Visit: Payer: Self-pay | Admitting: Nurse Practitioner

## 2019-09-03 LAB — COMPREHENSIVE METABOLIC PANEL
ALT: 13 IU/L (ref 0–32)
AST: 14 IU/L (ref 0–40)
Albumin/Globulin Ratio: 1.8 (ref 1.2–2.2)
Albumin: 4.2 g/dL (ref 3.8–4.9)
Alkaline Phosphatase: 108 IU/L (ref 39–117)
BUN/Creatinine Ratio: 21 (ref 12–28)
BUN: 21 mg/dL (ref 8–27)
Bilirubin Total: 0.5 mg/dL (ref 0.0–1.2)
CO2: 24 mmol/L (ref 20–29)
Calcium: 9.5 mg/dL (ref 8.7–10.3)
Chloride: 104 mmol/L (ref 96–106)
Creatinine, Ser: 0.99 mg/dL (ref 0.57–1.00)
GFR calc Af Amer: 72 mL/min/{1.73_m2} (ref >59)
GFR calc non Af Amer: 62 mL/min/{1.73_m2} (ref >59)
Globulin, Total: 2.3 g/dL (ref 1.5–4.5)
Glucose: 94 mg/dL (ref 65–99)
Potassium: 4.3 mmol/L (ref 3.5–5.2)
Sodium: 140 mmol/L (ref 134–144)
Total Protein: 6.5 g/dL (ref 6.0–8.5)

## 2019-09-03 LAB — CBC
Hematocrit: 42.8 % (ref 34.0–46.6)
Hemoglobin: 14 g/dL (ref 11.1–15.9)
MCH: 28.7 pg (ref 26.6–33.0)
MCHC: 32.7 g/dL (ref 31.5–35.7)
MCV: 88 fL (ref 79–97)
Platelets: 368 10*3/uL (ref 150–450)
RBC: 4.88 x10E6/uL (ref 3.77–5.28)
RDW: 13 % (ref 11.7–15.4)
WBC: 4.3 10*3/uL (ref 3.4–10.8)

## 2019-09-03 LAB — LIPID PANEL W/O CHOL/HDL RATIO
Cholesterol, Total: 189 mg/dL (ref 100–199)
HDL: 62 mg/dL (ref >39)
LDL Chol Calc (NIH): 116 mg/dL — ABNORMAL HIGH (ref 0–99)
Triglycerides: 60 mg/dL (ref 0–149)
VLDL Cholesterol Cal: 11 mg/dL (ref 5–40)

## 2019-09-03 LAB — TSH: TSH: 2.24 u[IU]/mL (ref 0.450–4.500)

## 2019-09-03 LAB — T4, FREE: Free T4: 1.48 ng/dL (ref 0.82–1.77)

## 2019-09-03 LAB — VITAMIN D 25 HYDROXY (VIT D DEFICIENCY, FRACTURES): Vit D, 25-Hydroxy: 20.5 ng/mL — ABNORMAL LOW (ref 30.0–100.0)

## 2019-09-04 LAB — UA/M W/RFLX CULTURE, ROUTINE
Bilirubin, UA: NEGATIVE
Glucose, UA: NEGATIVE
Ketones, UA: NEGATIVE
Nitrite, UA: NEGATIVE
Protein,UA: NEGATIVE
RBC, UA: NEGATIVE
Specific Gravity, UA: 1.024 (ref 1.005–1.030)
Urobilinogen, Ur: 0.2 mg/dL (ref 0.2–1.0)
pH, UA: 5 (ref 5.0–7.5)

## 2019-09-04 LAB — URINE CULTURE, REFLEX

## 2019-09-04 LAB — MICROSCOPIC EXAMINATION: Casts: NONE SEEN /lpf

## 2019-09-06 ENCOUNTER — Encounter: Payer: Self-pay | Admitting: Nurse Practitioner

## 2019-09-06 ENCOUNTER — Other Ambulatory Visit: Payer: Self-pay | Admitting: Nurse Practitioner

## 2019-09-06 ENCOUNTER — Telehealth: Payer: Self-pay | Admitting: Nurse Practitioner

## 2019-09-06 DIAGNOSIS — Z1231 Encounter for screening mammogram for malignant neoplasm of breast: Secondary | ICD-10-CM

## 2019-09-06 MED ORDER — CIPROFLOXACIN HCL 500 MG PO TABS: 500.0000 mg | ORAL_TABLET | Freq: Two times a day (BID) | ORAL | 0 refills | Status: DC

## 2019-09-06 NOTE — Telephone Encounter (Signed)
Pt notified of uti and rx sent to pharmacy

## 2019-09-06 NOTE — Telephone Encounter (Signed)
-----   Message from Lavera Guise, MD sent at 09/06/2019  1:33 PM EDT ----- Urine culture is positive for UTI, please call in Cipro 500 mg bid x 7 days

## 2019-09-16 ENCOUNTER — Ambulatory Visit
Admission: RE | Admit: 2019-09-16 | Discharge: 2019-09-16 | Disposition: A | Discharge status: Home / Self Care | Payer: BC Managed Care – PPO | Source: Ambulatory Visit | Attending: Nurse Practitioner | Admitting: Nurse Practitioner

## 2019-09-16 ENCOUNTER — Other Ambulatory Visit: Payer: Self-pay

## 2019-09-16 DIAGNOSIS — Z1231 Encounter for screening mammogram for malignant neoplasm of breast: Secondary | ICD-10-CM

## 2019-09-17 NOTE — Progress Notes (Signed)
Negative mammogram

## 2019-12-02 ENCOUNTER — Ambulatory Visit: Payer: BC Managed Care – PPO | Admitting: Nurse Practitioner

## 2019-12-09 ENCOUNTER — Ambulatory Visit: Payer: BC Managed Care – PPO | Admitting: Nurse Practitioner

## 2019-12-20 ENCOUNTER — Telehealth: Payer: Self-pay

## 2019-12-20 NOTE — Telephone Encounter (Signed)
Patient rescheduled appointment on 12/23/2019 to 01/11/2020 due to work. [employer]

## 2019-12-23 ENCOUNTER — Ambulatory Visit: Payer: BC Managed Care – PPO | Admitting: Nurse Practitioner

## 2020-01-07 ENCOUNTER — Telehealth: Payer: Self-pay

## 2020-01-07 NOTE — Telephone Encounter (Signed)
SCREENED AND CONFIRMED FOR 01-11-20 OV.

## 2020-01-11 ENCOUNTER — Other Ambulatory Visit: Payer: Self-pay

## 2020-01-11 ENCOUNTER — Encounter: Payer: Self-pay | Admitting: Nurse Practitioner

## 2020-01-11 ENCOUNTER — Ambulatory Visit: Payer: BC Managed Care – PPO | Admitting: Nurse Practitioner

## 2020-01-11 VITALS — BP 132/88 | HR 71 | Temp 95.6°F | Resp 16 | Ht 66.0 in | Wt 164.4 lb

## 2020-01-11 DIAGNOSIS — R5383 Other fatigue: Secondary | ICD-10-CM

## 2020-01-11 DIAGNOSIS — F988 Other specified behavioral and emotional disorders with onset usually occurring in childhood and adolescence: Secondary | ICD-10-CM | POA: Diagnosis not present

## 2020-01-11 MED ORDER — ADDERALL XR 30 MG PO CP24: 30.0000 mg | ORAL_CAPSULE | Freq: Every day | ORAL | 0 refills | Status: DC

## 2020-01-11 NOTE — Progress Notes (Signed)
[facility] [place], [place] [address]  Internal MEDICINE  Office Visit Note  Patient Name: April Wilkins  [id number]  [id number]  Date of Service: 01/12/2020  Chief Complaint  Patient presents with  . Follow-up    weight management   . ADD    The patient is here for routine follow up. She is currently taking adderall XR 30mg  daily when she works. She is currently working from home and working longer hours than she is used to. She states that she is doing well with this current dose of adderall. She has no negative side effects associated with taking this medication. She needs to have refills for this today.       Current Medication: Outpatient Encounter Medications as of 01/11/2020  Medication Sig  . acetaminophen (TYLENOL) 325 MG tablet Take by mouth.  . ADDERALL XR 30 MG 24 hr capsule Take 1 capsule (30 mg total) by mouth daily.  Marland Kitchen ibuprofen (ADVIL,MOTRIN) 200 MG tablet Take by mouth.  . meloxicam (MOBIC) 7.5 MG tablet Take 1 tablet (7.5 mg total) by mouth daily.  . [DISCONTINUED] ADDERALL XR 30 MG 24 hr capsule Take 1 capsule (30 mg total) by mouth daily.  . [DISCONTINUED] ADDERALL XR 30 MG 24 hr capsule Take 1 capsule (30 mg total) by mouth daily.  . [DISCONTINUED] ADDERALL XR 30 MG 24 hr capsule Take 1 capsule (30 mg total) by mouth daily.  . [DISCONTINUED] ciprofloxacin (CIPRO) 500 MG tablet Take 1 tablet (500 mg total) by mouth 2 (two) times daily. (Patient not taking: Reported on 01/11/2020)   No facility-administered encounter medications on file as of 01/11/2020.    Surgical History: Past Surgical History:  Procedure Laterality Date  . SKIN BIOPSY      Medical History: Past Medical History:  Diagnosis Date  . Allergy    Seasonal  . Basal cell carcinoma (BCC) of right upper arm 02/08/2010   Right deltoid  . Chicken pox   . Dysplastic nevi 2011   multiple sites, back, buttock; mod-severe  . Frequent headaches   . Melanoma ([place])  2010   left lower back  . Migraines     Family History: Family History  Problem Relation Age of Onset  . Hyperlipidemia Father   . Heart disease Father   . Diabetes Father   . Cancer Father        Skin Cancer  . Alcohol abuse Paternal Uncle   . Hyperlipidemia Paternal Uncle   . Heart disease Paternal Uncle   . Kidney disease Maternal Grandfather   . Hyperlipidemia Paternal Grandmother   . Heart disease Paternal Grandmother   . Drug abuse Paternal Grandmother   . Breast cancer Maternal Grandmother 70    Social History   Socioeconomic History  . Marital status: Single    Spouse name: Not on file  . Number of children: Not on file  . Years of education: Not on file  . Highest education level: Not on file  Occupational History  . Not on file  Tobacco Use  . Smoking status: Never Smoker  . Smokeless tobacco: Never Used  Substance and Sexual Activity  . Alcohol use: Yes    Alcohol/week: 0.0 standard drinks    Comment: Rare   . Drug use: No  . Sexual activity: Yes    Partners: Male  Other Topics Concern  . 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     Social Determinants of Health   Financial Resource Strain:   . Difficulty of Paying Living Expenses: Not on file  Food Insecurity:   . Worried About Charity fundraiser in the Last Year: Not on file  . Ran Out of Food in the Last Year: Not on file  Transportation Needs:   . Lack of Transportation (Medical): Not on file  . Lack of Transportation (Non-Medical): Not on file  Physical Activity:   . Days of Exercise per Week: Not on file  . Minutes of Exercise per Session: Not on file  Stress:   . Feeling of Stress : Not on file  Social Connections:   . Frequency of Communication with Friends and Family: Not on file  . Frequency of Social Gatherings with Friends and Family: Not on  file  . Attends Religious Services: Not on file  . Active Member of Clubs or Organizations: Not on file  . Attends Archivist Meetings: Not on file  . Marital Status: Not on file  Intimate Partner Violence:   . Fear of Current or Ex-Partner: Not on file  . Emotionally Abused: Not on file  . Physically Abused: Not on file  . Sexually Abused: Not on file      Review of Systems  Constitutional: Negative for chills, fatigue and unexpected weight change.  HENT: Negative for congestion, postnasal drip, rhinorrhea, sneezing and sore throat.   Respiratory: Negative for cough, chest tightness, shortness of breath and wheezing.   Cardiovascular: Negative for chest pain and palpitations.  Gastrointestinal: Negative for abdominal pain, constipation, diarrhea, nausea and vomiting.  Endocrine: Negative for cold intolerance, heat intolerance, polydipsia and polyuria.  Musculoskeletal: Positive for back pain and myalgias. Negative for arthralgias, joint swelling and neck pain.       Intermittent and worse after sitting for long periods of time.   Skin: Negative for rash.  Allergic/Immunologic: Negative for environmental allergies.  Neurological: Negative for dizziness, tremors, numbness and headaches.  Hematological: Negative for adenopathy. Does not bruise/bleed easily.  Psychiatric/Behavioral: Positive for decreased concentration. Negative for behavioral problems (Depression), sleep disturbance and suicidal ideas. The patient is not nervous/anxious.     Today's Vitals   01/11/20 0952  BP: 132/88  Pulse: 71  Resp: 16  Temp: (!) 95.6 F (35.3 C)  SpO2: 99%  Weight: 164 lb 6.4 oz (74.6 kg)  Height: 5\' 6"  (1.676 m)   Body mass index is 26.53 kg/m.  Physical Exam Constitutional:      General: She is not in acute distress.    Appearance: She is well-developed. She is not diaphoretic.  HENT:     Head: Normocephalic and atraumatic.     Mouth/Throat:     Pharynx: No  oropharyngeal exudate.  Eyes:     Pupils: Pupils are equal, round, and reactive to light.  Neck:     Thyroid: No thyromegaly.     Vascular: No JVD.     Trachea: No tracheal deviation.  Cardiovascular:     Rate and Rhythm: Normal rate and regular rhythm.     Heart sounds: Normal heart sounds. No murmur. No friction rub. No gallop.   Pulmonary:     Effort: Pulmonary effort is normal. No respiratory distress.     Breath sounds: No wheezing or rales.  Chest:     Chest wall:  No tenderness.  Abdominal:     General: Bowel sounds are normal.     Palpations: Abdomen is soft.  Musculoskeletal:        General: Normal range of motion.     Cervical back: Normal range of motion and neck supple.  Lymphadenopathy:     Cervical: No cervical adenopathy.  Skin:    General: Skin is warm and dry.  Neurological:     Mental Status: She is alert and oriented to person, place, and time.     Cranial Nerves: No cranial nerve deficit.  Psychiatric:        Behavior: Behavior normal.        Thought Content: Thought content normal.        Judgment: Judgment normal.    Assessment/Plan: 1. Other fatigue Related to stress. Stable. Continue to monitor.   2. 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 Three 30 day prescriptions sent to her pharmacy. Dates are 01/11/2020, 02/08/2020, and 03/08/2020 - ADDERALL XR 30 MG 24 hr capsule; Take 1 capsule (30 mg total) by mouth daily.  Dispense: 30 capsule; Refill: 0  General Counseling: Phylliss verbalizes understanding of the findings of todays visit and agrees with plan of treatment. I have discussed any further diagnostic evaluation that may be needed or ordered today. We also reviewed her medications today. she has been encouraged to call the office with any questions or concerns that should arise related to todays visit.  This patient was seen by [place] with Dr Lavera Guise as a part of collaborative  care agreement  Meds ordered this encounter  Medications  . DISCONTD: ADDERALL XR 30 MG 24 hr capsule    Sig: Take 1 capsule (30 mg total) by mouth daily.    Dispense:  30 capsule    Refill:  0    Order Specific Question:   Supervising Provider    Answer:   Lavera Guise [id number]  . DISCONTD: ADDERALL XR 30 MG 24 hr capsule    Sig: Take 1 capsule (30 mg total) by mouth daily.    Dispense:  30 capsule    Refill:  0    Fill after 02/08/2020    Order Specific Question:   Supervising Provider    Answer:   Lavera Guise [id number]  . ADDERALL XR 30 MG 24 hr capsule    Sig: Take 1 capsule (30 mg total) by mouth daily.    Dispense:  30 capsule    Refill:  0    Fill after 03/08/2020    Order Specific Question:   Supervising Provider    Answer:   Lavera Guise [id number]    Total time spent: 20 Minutes  Time spent includes review of chart, medications, test results, and follow up plan with the patient.      Dr Lavera Guise Internal medicine

## 2020-01-28 LAB — COLOGUARD: COLOGUARD: NEGATIVE

## 2020-03-03 ENCOUNTER — Encounter: Payer: Self-pay | Admitting: Nurse Practitioner

## 2020-04-11 ENCOUNTER — Encounter: Payer: Self-pay | Admitting: Nurse Practitioner

## 2020-04-11 NOTE — Telephone Encounter (Signed)
Can you change patient appointment and let her know> thanks.

## 2020-04-13 ENCOUNTER — Ambulatory Visit: Payer: BC Managed Care – PPO | Admitting: Nurse Practitioner

## 2020-06-02 ENCOUNTER — Telehealth: Payer: Self-pay

## 2020-06-02 NOTE — Telephone Encounter (Signed)
[name] to confirm and screen for 06-06-20 ov.

## 2020-06-06 ENCOUNTER — Other Ambulatory Visit: Payer: Self-pay

## 2020-06-06 ENCOUNTER — Encounter: Payer: Self-pay | Admitting: Nurse Practitioner

## 2020-06-06 ENCOUNTER — Ambulatory Visit: Payer: BC Managed Care – PPO | Admitting: Nurse Practitioner

## 2020-06-06 VITALS — BP 138/82 | HR 70 | Temp 97.4°F | Resp 16 | Ht 66.0 in | Wt 172.4 lb

## 2020-06-06 DIAGNOSIS — R030 Elevated blood-pressure reading, without diagnosis of hypertension: Secondary | ICD-10-CM

## 2020-06-06 DIAGNOSIS — Z79899 Other long term (current) drug therapy: Secondary | ICD-10-CM

## 2020-06-06 DIAGNOSIS — F988 Other specified behavioral and emotional disorders with onset usually occurring in childhood and adolescence: Secondary | ICD-10-CM

## 2020-06-06 LAB — POCT URINE DRUG SCREEN
Methylenedioxyamphetamine: NOT DETECTED
POC Amphetamine UR: NOT DETECTED
POC BENZODIAZEPINES UR: NOT DETECTED
POC Barbiturate UR: NOT DETECTED
POC Cocaine UR: NOT DETECTED
POC Ecstasy UR: NOT DETECTED
POC Marijuana UR: NOT DETECTED
POC Methadone UR: NOT DETECTED
POC Methamphetamine UR: NOT DETECTED
POC Opiate Ur: NOT DETECTED
POC Oxycodone UR: NOT DETECTED
POC PHENCYCLIDINE UR: NOT DETECTED
POC SPECIFIC GRAVITY URINE: NORMAL
POC TRICYCLICS UR: NOT DETECTED

## 2020-06-06 MED ORDER — ADDERALL XR 30 MG PO CP24: 30.0000 mg | ORAL_CAPSULE | Freq: Every day | ORAL | 0 refills | Status: DC

## 2020-06-06 NOTE — Progress Notes (Signed)
[facility] [place], [place] [address]  Internal MEDICINE  Office Visit Note  Patient Name: April Wilkins  [id number]  [id number]  Date of Service: 07/02/2020  Chief Complaint  Patient presents with  . Follow-up    The patient is here for routine follow up visit.  She is currently taking adderall XR 30mg  daily when she works. She is working from home and working longer hours than she is used to. She states that she is doing well with this current dose of adderall. It helps to keep her focused and on track while at work. She has no negative side effects associated with taking this medication. She needs to have refills for this today. Blood pressure is elevated upon arrival. This did return to normal limits during the time of visit.       Current Medication: Outpatient Encounter Medications as of 06/06/2020  Medication Sig  . acetaminophen (TYLENOL) 325 MG tablet Take by mouth.  . ADDERALL XR 30 MG 24 hr capsule Take 1 capsule (30 mg total) by mouth daily.  Marland Kitchen ibuprofen (ADVIL,MOTRIN) 200 MG tablet Take by mouth.  . meloxicam (MOBIC) 7.5 MG tablet Take 1 tablet (7.5 mg total) by mouth daily.  . [DISCONTINUED] ADDERALL XR 30 MG 24 hr capsule Take 1 capsule (30 mg total) by mouth daily.  . [DISCONTINUED] ADDERALL XR 30 MG 24 hr capsule Take 1 capsule (30 mg total) by mouth daily.  . [DISCONTINUED] ADDERALL XR 30 MG 24 hr capsule Take 1 capsule (30 mg total) by mouth daily.   No facility-administered encounter medications on file as of 06/06/2020.    Surgical History: Past Surgical History:  Procedure Laterality Date  . SKIN BIOPSY      Medical History: Past Medical History:  Diagnosis Date  . Allergy    Seasonal  . Basal cell carcinoma (BCC) of right upper arm 02/08/2010   Right deltoid  . Chicken pox   . Dysplastic nevi 2011   multiple sites, back, buttock; mod-severe  . Frequent headaches   . Melanoma ([place]) 2010   left lower back  .  Migraines     Family History: Family History  Problem Relation Age of Onset  . Hyperlipidemia Father   . Heart disease Father   . Diabetes Father   . Cancer Father        Skin Cancer  . Alcohol abuse Paternal Uncle   . Hyperlipidemia Paternal Uncle   . Heart disease Paternal Uncle   . Kidney disease Maternal Grandfather   . Hyperlipidemia Paternal Grandmother   . Heart disease Paternal Grandmother   . Drug abuse Paternal Grandmother   . Breast cancer Maternal Grandmother 70    Social History   Socioeconomic History  . Marital status: Single    Spouse name: Not on file  . Number of children: Not on file  . Years of education: Not on file  . Highest education level: Not on file  Occupational History  . Not on file  Tobacco Use  . Smoking status: Never Smoker  . Smokeless tobacco: Never Used  Substance and Sexual Activity  . Alcohol use: Yes    Alcohol/week: 0.0 standard drinks    Comment: Rare   . Drug use: No  . Sexual activity: Yes    Partners: Male  Other Topics Concern  . 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     Social Determinants of Health   Financial Resource Strain:   . Difficulty of Paying Living Expenses: Not on file  Food Insecurity:   . Worried About Charity fundraiser in the Last Year: Not on file  . Ran Out of Food in the Last Year: Not on file  Transportation Needs:   . Lack of Transportation (Medical): Not on file  . Lack of Transportation (Non-Medical): Not on file  Physical Activity:   . Days of Exercise per Week: Not on file  . Minutes of Exercise per Session: Not on file  Stress:   . Feeling of Stress : Not on file  Social Connections:   . Frequency of Communication with Friends and Family: Not on file  . Frequency of Social Gatherings with Friends and Family: Not on file  . Attends Religious  Services: Not on file  . Active Member of Clubs or Organizations: Not on file  . Attends Archivist Meetings: Not on file  . Marital Status: Not on file  Intimate Partner Violence:   . Fear of Current or Ex-Partner: Not on file  . Emotionally Abused: Not on file  . Physically Abused: Not on file  . Sexually Abused: Not on file      Review of Systems  Constitutional: Negative for chills, fatigue and unexpected weight change.  HENT: Negative for congestion, postnasal drip, rhinorrhea, sneezing and sore throat.   Respiratory: Negative for cough, chest tightness, shortness of breath and wheezing.   Cardiovascular: Negative for chest pain and palpitations.  Gastrointestinal: Negative for abdominal pain, constipation, diarrhea, nausea and vomiting.  Endocrine: Negative for cold intolerance, heat intolerance, polydipsia and polyuria.  Musculoskeletal: Positive for back pain and myalgias. Negative for arthralgias, joint swelling and neck pain.       Intermittent and worse after sitting for long periods of time.   Skin: Negative for rash.  Allergic/Immunologic: Negative for environmental allergies.  Neurological: Negative for dizziness, tremors, numbness and headaches.  Hematological: Negative for adenopathy. Does not bruise/bleed easily.  Psychiatric/Behavioral: Positive for decreased concentration. Negative for behavioral problems (Depression), sleep disturbance and suicidal ideas. The patient is not nervous/anxious.     Today's Vitals   06/06/20 1554  BP: (!) 138/82  Pulse: 70  Resp: 16  Temp: (!) 97.4 F (36.3 C)  SpO2: 98%  Weight: 172 lb 6.4 oz (78.2 kg)  Height: 5\' 6"  (1.676 m)   Body mass index is 27.83 kg/m.  Physical Exam Vitals and nursing note reviewed.  Constitutional:      General: She is not in acute distress.    Appearance: Normal appearance. She is well-developed. She is not diaphoretic.  HENT:     Head: Normocephalic and atraumatic.      Mouth/Throat:     Mouth: Mucous membranes are moist.     Pharynx: No oropharyngeal exudate.  Eyes:     Pupils: Pupils are equal, round, and reactive to light.  Neck:     Thyroid: No thyromegaly.     Vascular: No JVD.     Trachea: No tracheal deviation.  Cardiovascular:     Rate and Rhythm: Normal rate and regular rhythm.     Heart sounds: Normal heart sounds. No murmur heard.  No friction rub. No gallop.   Pulmonary:     Effort: Pulmonary effort is normal. No respiratory distress.  Breath sounds: Normal breath sounds. No wheezing or rales.  Chest:     Chest wall: No tenderness.  Abdominal:     Palpations: Abdomen is soft.  Musculoskeletal:        General: Normal range of motion.     Cervical back: Normal range of motion and neck supple.  Lymphadenopathy:     Cervical: No cervical adenopathy.  Skin:    General: Skin is warm and dry.  Neurological:     Mental Status: She is alert and oriented to person, place, and time.     Cranial Nerves: No cranial nerve deficit.  Psychiatric:        Mood and Affect: Mood normal.        Behavior: Behavior normal.        Thought Content: Thought content normal.        Judgment: Judgment normal.    Assessment/Plan:  1. Elevated blood-pressure reading without diagnosis of hypertension Discussed diet and lifestyle changes needed to help keep blood pressure low. Will monitor closely.   2. Attention deficit disorder (ADD) in adult Well managed. Continue with adderall XR 30mg  daily when needed. Three 30 day prescriptions provided today. Dates are 06/06/2020, 04/04/2020, and 08/03/2020. - ADDERALL XR 30 MG 24 hr capsule; Take 1 capsule (30 mg total) by mouth daily.  Dispense: 30 capsule; Refill: 0  3. Encounter for long-term (current) use of high-risk medication - POCT Urine Drug Screen negative for all controlled substances   General Counseling: Glynna verbalizes understanding of the findings of todays visit and agrees with plan of  treatment. I have discussed any further diagnostic evaluation that may be needed or ordered today. We also reviewed her medications today. she has been encouraged to call the office with any questions or concerns that should arise related to todays visit.  Refilled Controlled medications today. Reviewed risks and possible side effects associated with taking Stimulants. Combination of these drugs with other psychotropic medications could cause dizziness and drowsiness. Pt needs to Monitor symptoms and exercise caution in driving and operating heavy machinery to avoid damages to oneself, to others and to the surroundings. Patient verbalized understanding in this matter. Dependence and abuse for these drugs will be monitored closely. A Controlled substance policy and procedure is on file which allows [facility] to order a urine drug screen test at any visit. Patient understands and agrees with the plan..  This patient was seen by Leretha Pol FNP Collaboration with Dr Lavera Guise as a part of collaborative care agreement\  Orders Placed This Encounter  Procedures  . POCT Urine Drug Screen    Meds ordered this encounter  Medications  . DISCONTD: ADDERALL XR 30 MG 24 hr capsule    Sig: Take 1 capsule (30 mg total) by mouth daily.    Dispense:  30 capsule    Refill:  0    Order Specific Question:   Supervising Provider    Answer:   Lavera Guise [[id number]]  . DISCONTD: ADDERALL XR 30 MG 24 hr capsule    Sig: Take 1 capsule (30 mg total) by mouth daily.    Dispense:  30 capsule    Refill:  0    Fill after 07/05/2020    Order Specific Question:   Supervising Provider    Answer:   Lavera Guise [[id number]]  . ADDERALL XR 30 MG 24 hr capsule    Sig: Take 1 capsule (30 mg total) by mouth daily.  Dispense:  30 capsule    Refill:  0    Fill after 08/03/2020    Order Specific Question:   Supervising Provider    Answer:   Lavera Guise [[id number]]    Total time spent: 25 Minutes   Time spent  includes review of chart, medications, test results, and follow up plan with the patient.      Dr Lavera Guise Internal medicine

## 2020-07-02 DIAGNOSIS — R030 Elevated blood-pressure reading, without diagnosis of hypertension: Secondary | ICD-10-CM | POA: Insufficient documentation

## 2020-07-18 ENCOUNTER — Encounter: Payer: BC Managed Care – PPO | Admitting: Dermatology

## 2020-09-05 ENCOUNTER — Encounter: Payer: Self-pay | Admitting: Nurse Practitioner

## 2020-09-07 ENCOUNTER — Encounter: Payer: BC Managed Care – PPO | Admitting: Nurse Practitioner

## 2020-09-20 ENCOUNTER — Other Ambulatory Visit: Payer: Self-pay | Admitting: Nurse Practitioner

## 2020-09-20 DIAGNOSIS — Z1231 Encounter for screening mammogram for malignant neoplasm of breast: Secondary | ICD-10-CM

## 2020-10-04 ENCOUNTER — Other Ambulatory Visit: Payer: Self-pay

## 2020-10-04 ENCOUNTER — Ambulatory Visit
Admission: RE | Admit: 2020-10-04 | Discharge: 2020-10-04 | Disposition: A | Discharge status: Home / Self Care | Payer: BC Managed Care – PPO | Source: Ambulatory Visit | Attending: Nurse Practitioner | Admitting: Nurse Practitioner

## 2020-10-04 DIAGNOSIS — Z1231 Encounter for screening mammogram for malignant neoplasm of breast: Secondary | ICD-10-CM | POA: Diagnosis not present

## 2020-10-12 ENCOUNTER — Other Ambulatory Visit: Payer: Self-pay

## 2020-10-12 ENCOUNTER — Encounter: Payer: Self-pay | Admitting: Nurse Practitioner

## 2020-10-12 ENCOUNTER — Ambulatory Visit (INDEPENDENT_AMBULATORY_CARE_PROVIDER_SITE_OTHER): Payer: BC Managed Care – PPO | Admitting: Nurse Practitioner

## 2020-10-12 VITALS — BP 131/84 | HR 92 | Temp 97.6°F | Resp 16 | Ht 66.0 in | Wt 172.4 lb

## 2020-10-12 DIAGNOSIS — F988 Other specified behavioral and emotional disorders with onset usually occurring in childhood and adolescence: Secondary | ICD-10-CM | POA: Diagnosis not present

## 2020-10-12 DIAGNOSIS — Z0001 Encounter for general adult medical examination with abnormal findings: Secondary | ICD-10-CM

## 2020-10-12 DIAGNOSIS — L03115 Cellulitis of right lower limb: Secondary | ICD-10-CM | POA: Diagnosis not present

## 2020-10-12 DIAGNOSIS — R300 Dysuria: Secondary | ICD-10-CM

## 2020-10-12 DIAGNOSIS — R5383 Other fatigue: Secondary | ICD-10-CM

## 2020-10-12 DIAGNOSIS — L02415 Cutaneous abscess of right lower limb: Secondary | ICD-10-CM

## 2020-10-12 MED ORDER — ADDERALL XR 30 MG PO CP24: 30.0000 mg | ORAL_CAPSULE | Freq: Every day | ORAL | 0 refills | Status: DC

## 2020-10-12 MED ORDER — SULFAMETHOXAZOLE-TRIMETHOPRIM 800-160 MG PO TABS: 1.0000 | ORAL_TABLET | Freq: Two times a day (BID) | ORAL | 0 refills | Status: DC

## 2020-10-12 MED ORDER — MUPIROCIN 2 % EX OINT: TOPICAL_OINTMENT | CUTANEOUS | 1 refills | Status: DC

## 2020-10-12 NOTE — Progress Notes (Signed)
[facility] [place], [place] [address]  Internal MEDICINE  Office Visit Note  Patient Name: April Wilkins  [id number]  [id number]  Date of Service: 11/12/2020   Pt is here for routine health maintenance examination  Chief Complaint  Patient presents with  . Annual Exam  . Quality Metric Gaps    flu  . controlled substance form    reviewed with PT  . Foot Pain    back of heel may be infected     The patient presents for health maintenance exam. Had accident at home about three weeks ago when she dropped a jar on the back of her right heel. She had large laceration, nearly to the tendon. Was seen at urgent care. Wound was cleaned and glued together. Center of the wound is scabbed with a great deal of erythema around it. She states that it is draining a small amount of brown colored fluid intermittently.  - She is currently taking adderall XR $RemoveBef'30mg'WxmdkAtDTW$  daily when she works. She is working from home and working longer hours than she is used to. She states that she is doing well with this current dose of adderall. It helps to keep her focused and on track while at work. She has no negative side effects associated with taking this medication. She needs to have refills for this today. She was administered a Conner's welf assessment for adult onset ADD. She scored 4/6 indicating that she does have ADD.     Current Medication: Outpatient Encounter Medications as of 10/12/2020  Medication Sig  . acetaminophen (TYLENOL) 325 MG tablet Take by mouth.  . ADDERALL XR 30 MG 24 hr capsule Take 1 capsule (30 mg total) by mouth daily.  Marland Kitchen ibuprofen (ADVIL,MOTRIN) 200 MG tablet Take by mouth.  . meloxicam (MOBIC) 7.5 MG tablet Take 1 tablet (7.5 mg total) by mouth daily.  . [DISCONTINUED] ADDERALL XR 30 MG 24 hr capsule Take 1 capsule (30 mg total) by mouth daily.  . mupirocin ointment (BACTROBAN) 2 % Apply to effected areas twice daily for next 7 to 10 days.  Marland Kitchen  sulfamethoxazole-trimethoprim (BACTRIM DS) 800-160 MG tablet Take 1 tablet by mouth 2 (two) times daily.   No facility-administered encounter medications on file as of 10/12/2020.    Surgical History: Past Surgical History:  Procedure Laterality Date  . SKIN BIOPSY      Medical History: Past Medical History:  Diagnosis Date  . Actinic keratosis 08/31/2010   Right forearm. Pigmented.  . Allergy    Seasonal  . Basal cell carcinoma (BCC) of right upper arm 02/08/2010   Right deltoid. Superficial.   . Chicken pox   . Dysplastic nevus 02/08/2010   R mid back bra line. Moderate to severe atypia, close to margin. Excised 03/28/2010, margins free.  Marland Kitchen Dysplastic nevus 02/08/2010   Left mid to low back above MM site. Moderate atypia, close to margin  . Dysplastic nevus 02/08/2010   Left sup. lat. buttocks near side. Moderate atypia, close to margin.  Marland Kitchen Dysplastic nevus 05/22/2015   Left lower lat. back. Moderat to severe atypia, close to margin. Excised 06/12/2015, margins free.  Marland Kitchen Dysplastic nevus 03/31/2018   Central upper abdomen. Moderate atypia, limited margins free.  . Frequent headaches   . Melanoma ([place]) 2010   left lower back  . Migraines     Family History: Family History  Problem Relation Age of Onset  . Hyperlipidemia Father   . Heart disease Father   .  Diabetes Father   . Cancer Father        Skin Cancer  . Alcohol abuse Paternal Uncle   . Hyperlipidemia Paternal Uncle   . Heart disease Paternal Uncle   . Kidney disease Maternal Grandfather   . Hyperlipidemia Paternal Grandmother   . Heart disease Paternal Grandmother   . Drug abuse Paternal Grandmother   . Breast cancer Maternal Grandmother 70      Review of Systems  Constitutional: Negative for activity change, chills, fatigue and unexpected weight change.  HENT: Negative for congestion, postnasal drip, rhinorrhea, sneezing and sore throat.   Respiratory: Negative for cough, chest tightness and shortness  of breath.   Cardiovascular: Negative for chest pain and palpitations.  Gastrointestinal: Negative for abdominal pain, constipation, diarrhea, nausea and vomiting.  Genitourinary: Negative for dysuria and frequency.  Musculoskeletal: Negative for arthralgias, back pain, joint swelling and neck pain.  Skin: Positive for wound. Negative for rash.       Back of right heel. Feels as though it might be getting infected. Red and warm. Wound is scabbed. Draining small amount of fluid   Neurological: Negative.  Negative for tremors and numbness.  Hematological: Negative for adenopathy. Does not bruise/bleed easily.  Psychiatric/Behavioral: Negative for behavioral problems (Depression), sleep disturbance and suicidal ideas. The patient is not nervous/anxious.      Today's Vitals   10/12/20 1502  BP: 131/84  Pulse: 92  Resp: 16  Temp: 97.6 F (36.4 C)  SpO2: 96%  Weight: 172 lb 6.4 oz (78.2 kg)  Height: $Remove'5\' 6"'lbxDLpt$  (1.676 m)   Body mass index is 27.83 kg/m.  Physical Exam Vitals and nursing note reviewed.  Constitutional:      General: She is not in acute distress.    Appearance: Normal appearance. She is well-developed and well-nourished. She is not diaphoretic.  HENT:     Head: Normocephalic and atraumatic.     Mouth/Throat:     Pharynx: Posterior oropharyngeal erythema present. No oropharyngeal exudate.  Eyes:     Extraocular Movements: EOM normal.     Pupils: Pupils are equal, round, and reactive to light.  Neck:     Thyroid: No thyromegaly.     Vascular: No JVD.     Trachea: No tracheal deviation.  Cardiovascular:     Rate and Rhythm: Normal rate and regular rhythm.     Heart sounds: Normal heart sounds. No murmur heard. No friction rub. No gallop.   Pulmonary:     Effort: Pulmonary effort is normal. No respiratory distress.     Breath sounds: Normal breath sounds. No wheezing or rales.  Chest:     Chest wall: No tenderness.  Breasts:     Right: Normal. No swelling,  bleeding, inverted nipple, mass, nipple discharge, skin change, tenderness or axillary adenopathy.     Left: Normal. No swelling, bleeding, inverted nipple, mass, nipple discharge, skin change, tenderness or axillary adenopathy.    Abdominal:     Palpations: Abdomen is soft.  Musculoskeletal:        General: Normal range of motion.     Cervical back: Normal range of motion and neck supple.  Lymphadenopathy:     Cervical: No cervical adenopathy.     Upper Body:     Right upper body: No axillary adenopathy.     Left upper body: No axillary adenopathy.  Skin:    General: Skin is warm and dry.     Comments: Infected lesion on back of heel. Small amount  of necrotic tissue present. Lesion red and warm with small amount of drainage present.   Neurological:     General: No focal deficit present.     Mental Status: She is alert and oriented to person, place, and time.     Cranial Nerves: No cranial nerve deficit.  Psychiatric:        Mood and Affect: Mood and affect and mood normal.        Behavior: Behavior normal.        Thought Content: Thought content normal.        Judgment: Judgment normal.      LABS: Recent Results (from the past 2160 hour(s))  UA/M w/rflx Culture, Routine     Status: Abnormal   Collection Time: 10/12/20  3:00 PM   Specimen: Urine   Urine  Result Value Ref Range   Specific Gravity, UA 1.011 1.005 - 1.030   pH, UA 5.0 5.0 - 7.5   Color, UA Yellow Yellow   Appearance Ur Clear Clear   Leukocytes,UA Negative Negative   Protein,UA Negative Negative/Trace   Glucose, UA Negative Negative   Ketones, UA 1+ (A) Negative   RBC, UA Negative Negative   Bilirubin, UA Negative Negative   Urobilinogen, Ur 0.2 0.2 - 1.0 mg/dL   Nitrite, UA Negative Negative   Microscopic Examination Comment     Comment: Microscopic follows if indicated.   Microscopic Examination See below:     Comment: Microscopic was indicated and was performed.   Urinalysis Reflex Comment      Comment: This specimen will not reflex to a Urine Culture.  Microscopic Examination     Status: None   Collection Time: 10/12/20  3:00 PM   Urine  Result Value Ref Range   WBC, UA None seen 0 - 5 /hpf   RBC None seen 0 - 2 /hpf   Epithelial Cells (non renal) None seen 0 - 10 /hpf   Casts None seen None seen /lpf   Bacteria, UA None seen None seen/Few  Comprehensive metabolic panel     Status: Abnormal   Collection Time: 10/18/20 12:00 AM  Result Value Ref Range   Glucose 91 65 - 99 mg/dL   BUN 29 (H) 8 - 27 mg/dL   Creatinine, Ser 1.11 (H) 0.57 - 1.00 mg/dL   GFR calc non Af Amer 54 (L) >59 mL/min/1.73   GFR calc Af Amer 62 >59 mL/min/1.73    Comment: **In accordance with recommendations from the NKF-ASN Task force,**   [facility] is in the process of updating its eGFR calculation to the   2021 CKD-EPI creatinine equation that estimates kidney function   without a race variable.    BUN/Creatinine Ratio 26 12 - 28   Sodium 140 134 - 144 mmol/L   Potassium 4.5 3.5 - 5.2 mmol/L   Chloride 103 96 - 106 mmol/L   CO2 20 20 - 29 mmol/L   Calcium 9.9 8.7 - 10.3 mg/dL   Total Protein 7.3 6.0 - 8.5 g/dL   Albumin 4.4 3.8 - 4.8 g/dL   Globulin, Total 2.9 1.5 - 4.5 g/dL   Albumin/Globulin Ratio 1.5 1.2 - 2.2   Bilirubin Total 0.3 0.0 - 1.2 mg/dL   Alkaline Phosphatase 106 44 - 121 IU/L    Comment:               **Please note reference interval change**   AST 16 0 - 40 IU/L   ALT 17 0 - 32 IU/L  CBC     Status: Abnormal   Collection Time: 10/18/20 12:00 AM  Result Value Ref Range   WBC 4.3 3.4 - 10.8 x10E3/uL   RBC 5.29 (H) 3.77 - 5.28 x10E6/uL   Hemoglobin 15.1 11.1 - 15.9 g/dL   Hematocrit 45.8 34.0 - 46.6 %   MCV 87 79 - 97 fL   MCH 28.5 26.6 - 33.0 pg   MCHC 33.0 31.5 - 35.7 g/dL   RDW 12.5 11.7 - 15.4 %   Platelets 360 150 - 450 x10E3/uL  Lipid Panel With LDL/HDL Ratio     Status: Abnormal   Collection Time: 10/18/20 12:00 AM  Result Value Ref Range   Cholesterol, Total 205  (H) 100 - 199 mg/dL   Triglycerides 45 0 - 149 mg/dL   HDL 60 >39 mg/dL   VLDL Cholesterol Cal 8 5 - 40 mg/dL   LDL Chol Calc (NIH) 137 (H) 0 - 99 mg/dL   LDL/HDL Ratio 2.3 0.0 - 3.2 ratio    Comment:                                     LDL/HDL Ratio                                             Men  Women                               1/2 Avg.Risk  1.0    1.5                                   Avg.Risk  3.6    3.2                                2X Avg.Risk  6.2    5.0                                3X Avg.Risk  8.0    6.1   T4, free     Status: None   Collection Time: 10/18/20 12:00 AM  Result Value Ref Range   Free T4 1.41 0.82 - 1.77 ng/dL  TSH     Status: None   Collection Time: 10/18/20 12:00 AM  Result Value Ref Range   TSH 1.920 0.450 - 4.500 uIU/mL  VITAMIN D 25 Hydroxy (Vit-D Deficiency, Fractures)     Status: Abnormal   Collection Time: 10/18/20 12:00 AM  Result Value Ref Range   Vit D, 25-Hydroxy 18.1 (L) 30.0 - 100.0 ng/mL    Comment: Vitamin D deficiency has been defined by the [place] practice guideline as a level of serum 25-OH vitamin D less than 20 ng/mL (1,2). The Endocrine Society went on to further define vitamin D insufficiency as a level between 21 and 29 ng/mL (2). 1. IOM (Institute of Medicine). 2010. Dietary reference    intakes for calcium and D. [place]: The    [employer]. 2. Holick MF, Binkley [place], Bischoff-Ferrari HA,  et al.    Evaluation, treatment, and prevention of vitamin D    deficiency: an Endocrine Society clinical practice    guideline. JCEM. 2011 Jul; 96(7):1911-30.     Assessment/Plan:  1. Encounter for general adult medical examination with abnormal findings Annual health maintenance exam today. Order slip given to check routine, fasting labs.   2. Cellulitis and abscess of right leg Take bactrim DS twice daily for 14 days. Apply bactroban ointment to effected area twice daily  for next 7 days.  - sulfamethoxazole-trimethoprim (BACTRIM DS) 800-160 MG tablet; Take 1 tablet by mouth 2 (two) times daily.  Dispense: 28 tablet; Refill: 0 - mupirocin ointment (BACTROBAN) 2 %; Apply to effected areas twice daily for next 7 to 10 days.  Dispense: 22 g; Refill: 1  3. Attention deficit disorder (ADD) in adult Patient scored 4/6 on conner's self assessment for adult add. Single 30 day prescription sent to her pharmacy.  - ADDERALL XR 30 MG 24 hr capsule; Take 1 capsule (30 mg total) by mouth daily.  Dispense: 30 capsule; Refill: 0  4. Other fatigue Check labs   5. Dysuria - UA/M w/rflx Culture, Routine   General Counseling: antonetta clanton understanding of the findings of todays visit and agrees with plan of treatment. I have discussed any further diagnostic evaluation that may be needed or ordered today. We also reviewed her medications today. she has been encouraged to call the office with any questions or concerns that should arise related to todays visit.    Counseling:  This patient was seen by Leretha Pol FNP Collaboration with Dr Lavera Guise as a part of collaborative care agreement  Orders Placed This Encounter  Procedures  . Microscopic Examination  . UA/M w/rflx Culture, Routine    Meds ordered this encounter  Medications  . sulfamethoxazole-trimethoprim (BACTRIM DS) 800-160 MG tablet    Sig: Take 1 tablet by mouth 2 (two) times daily.    Dispense:  28 tablet    Refill:  0    Order Specific Question:   Supervising Provider    Answer:   Lavera Guise [[id number]]  . mupirocin ointment (BACTROBAN) 2 %    Sig: Apply to effected areas twice daily for next 7 to 10 days.    Dispense:  22 g    Refill:  1    Order Specific Question:   Supervising Provider    Answer:   Lavera Guise [[id number]]  . ADDERALL XR 30 MG 24 hr capsule    Sig: Take 1 capsule (30 mg total) by mouth daily.    Dispense:  30 capsule    Refill:  0    11/12/2020    Order Specific Question:    Supervising Provider    Answer:   Lavera Guise [[id number]]    Total time spent: 50 Minutes  Time spent includes review of chart, medications, test results, and follow up plan with the patient.     Lavera Guise, MD  Internal Medicine

## 2020-10-13 LAB — UA/M W/RFLX CULTURE, ROUTINE
Bilirubin, UA: NEGATIVE
Glucose, UA: NEGATIVE
Leukocytes,UA: NEGATIVE
Nitrite, UA: NEGATIVE
Protein,UA: NEGATIVE
RBC, UA: NEGATIVE
Specific Gravity, UA: 1.011 (ref 1.005–1.030)
Urobilinogen, Ur: 0.2 mg/dL (ref 0.2–1.0)
pH, UA: 5 (ref 5.0–7.5)

## 2020-10-13 LAB — MICROSCOPIC EXAMINATION
Bacteria, UA: NONE SEEN
Casts: NONE SEEN /lpf
Epithelial Cells (non renal): NONE SEEN /hpf (ref 0–10)
RBC, Urine: NONE SEEN /hpf (ref 0–2)
WBC, UA: NONE SEEN /hpf (ref 0–5)

## 2020-10-17 NOTE — Progress Notes (Signed)
Negative mammogram

## 2020-10-18 ENCOUNTER — Other Ambulatory Visit: Payer: Self-pay | Admitting: Nurse Practitioner

## 2020-10-19 LAB — COMPREHENSIVE METABOLIC PANEL
ALT: 17 IU/L (ref 0–32)
AST: 16 IU/L (ref 0–40)
Albumin/Globulin Ratio: 1.5 (ref 1.2–2.2)
Albumin: 4.4 g/dL (ref 3.8–4.8)
Alkaline Phosphatase: 106 IU/L (ref 44–121)
BUN/Creatinine Ratio: 26 (ref 12–28)
BUN: 29 mg/dL — ABNORMAL HIGH (ref 8–27)
Bilirubin Total: 0.3 mg/dL (ref 0.0–1.2)
CO2: 20 mmol/L (ref 20–29)
Calcium: 9.9 mg/dL (ref 8.7–10.3)
Chloride: 103 mmol/L (ref 96–106)
Creatinine, Ser: 1.11 mg/dL — ABNORMAL HIGH (ref 0.57–1.00)
GFR calc Af Amer: 62 mL/min/{1.73_m2} (ref >59)
GFR calc non Af Amer: 54 mL/min/{1.73_m2} — ABNORMAL LOW (ref >59)
Globulin, Total: 2.9 g/dL (ref 1.5–4.5)
Glucose: 91 mg/dL (ref 65–99)
Potassium: 4.5 mmol/L (ref 3.5–5.2)
Sodium: 140 mmol/L (ref 134–144)
Total Protein: 7.3 g/dL (ref 6.0–8.5)

## 2020-10-19 LAB — LIPID PANEL WITH LDL/HDL RATIO
Cholesterol, Total: 205 mg/dL — ABNORMAL HIGH (ref 100–199)
HDL: 60 mg/dL (ref >39)
LDL Chol Calc (NIH): 137 mg/dL — ABNORMAL HIGH (ref 0–99)
LDL/HDL Ratio: 2.3 ratio (ref 0.0–3.2)
Triglycerides: 45 mg/dL (ref 0–149)
VLDL Cholesterol Cal: 8 mg/dL (ref 5–40)

## 2020-10-19 LAB — T4, FREE: Free T4: 1.41 ng/dL (ref 0.82–1.77)

## 2020-10-19 LAB — CBC
Hematocrit: 45.8 % (ref 34.0–46.6)
Hemoglobin: 15.1 g/dL (ref 11.1–15.9)
MCH: 28.5 pg (ref 26.6–33.0)
MCHC: 33 g/dL (ref 31.5–35.7)
MCV: 87 fL (ref 79–97)
Platelets: 360 10*3/uL (ref 150–450)
RBC: 5.29 x10E6/uL — ABNORMAL HIGH (ref 3.77–5.28)
RDW: 12.5 % (ref 11.7–15.4)
WBC: 4.3 10*3/uL (ref 3.4–10.8)

## 2020-10-19 LAB — VITAMIN D 25 HYDROXY (VIT D DEFICIENCY, FRACTURES): Vit D, 25-Hydroxy: 18.1 ng/mL — ABNORMAL LOW (ref 30.0–100.0)

## 2020-10-19 LAB — TSH: TSH: 1.92 u[IU]/mL (ref 0.450–4.500)

## 2020-10-26 ENCOUNTER — Encounter: Payer: Self-pay | Admitting: Nurse Practitioner

## 2020-10-26 ENCOUNTER — Other Ambulatory Visit: Payer: Self-pay | Admitting: Internal Medicine

## 2020-10-26 DIAGNOSIS — R7989 Other specified abnormal findings of blood chemistry: Secondary | ICD-10-CM

## 2020-10-26 LAB — COLOGUARD

## 2020-10-26 NOTE — Telephone Encounter (Signed)
Please see

## 2020-11-12 DIAGNOSIS — Z0001 Encounter for general adult medical examination with abnormal findings: Secondary | ICD-10-CM | POA: Insufficient documentation

## 2020-11-12 DIAGNOSIS — L02415 Cutaneous abscess of right lower limb: Secondary | ICD-10-CM | POA: Insufficient documentation

## 2020-12-05 ENCOUNTER — Ambulatory Visit: Payer: BC Managed Care – PPO | Admitting: Dermatology

## 2020-12-07 ENCOUNTER — Other Ambulatory Visit: Payer: Self-pay

## 2020-12-07 ENCOUNTER — Encounter: Payer: Self-pay | Admitting: Hospice and Palliative Medicine

## 2020-12-07 ENCOUNTER — Ambulatory Visit: Payer: BC Managed Care – PPO | Admitting: Hospice and Palliative Medicine

## 2020-12-07 ENCOUNTER — Telehealth: Payer: Self-pay

## 2020-12-07 VITALS — BP 133/84 | HR 82 | Temp 97.6°F | Resp 16 | Ht 66.0 in | Wt 164.0 lb

## 2020-12-07 DIAGNOSIS — F988 Other specified behavioral and emotional disorders with onset usually occurring in childhood and adolescence: Secondary | ICD-10-CM | POA: Diagnosis not present

## 2020-12-07 DIAGNOSIS — R7989 Other specified abnormal findings of blood chemistry: Secondary | ICD-10-CM

## 2020-12-07 NOTE — Progress Notes (Signed)
[facility] [place], [place] [address]  Internal MEDICINE  Office Visit Note  Patient Name: April Wilkins  [id number]  [id number]  Date of Service: 12/07/2020  Chief Complaint  Patient presents with  . Follow-up    Review labs    HPI Patient is here for routine follow-up Last visit labs ordered--slightly elevated creatinine, advised to stop taking Meloxicam and ibuprofen at that time--she reports she had not been taking those medications at that time Labs were reordered for review, she has been unable to go have these done She has continued to hold Meloxicam as well as ibuprofen  She does continue to take Adderall as needed to help with focus and concentration--only takes on work days, never on weekends  Current Medication: Outpatient Encounter Medications as of 12/07/2020  Medication Sig  . acetaminophen (TYLENOL) 325 MG tablet Take by mouth.  . ADDERALL XR 30 MG 24 hr capsule Take 1 capsule (30 mg total) by mouth daily.  . [DISCONTINUED] ibuprofen (ADVIL,MOTRIN) 200 MG tablet Take by mouth.  . [DISCONTINUED] meloxicam (MOBIC) 7.5 MG tablet Take 1 tablet (7.5 mg total) by mouth daily.  . [DISCONTINUED] mupirocin ointment (BACTROBAN) 2 % Apply to effected areas twice daily for next 7 to 10 days.  . [DISCONTINUED] sulfamethoxazole-trimethoprim (BACTRIM DS) 800-160 MG tablet Take 1 tablet by mouth 2 (two) times daily.   No facility-administered encounter medications on file as of 12/07/2020.    Surgical History: Past Surgical History:  Procedure Laterality Date  . SKIN BIOPSY      Medical History: Past Medical History:  Diagnosis Date  . Actinic keratosis 08/31/2010   Right forearm. Pigmented.  . Allergy    Seasonal  . Basal cell carcinoma (BCC) of right upper arm 02/08/2010   Right deltoid. Superficial.   . Chicken pox   . Dysplastic nevus 02/08/2010   R mid back bra line. Moderate to severe atypia, close to margin. Excised 03/28/2010,  margins free.  Marland Kitchen Dysplastic nevus 02/08/2010   Left mid to low back above MM site. Moderate atypia, close to margin  . Dysplastic nevus 02/08/2010   Left sup. lat. buttocks near side. Moderate atypia, close to margin.  Marland Kitchen Dysplastic nevus 05/22/2015   Left lower lat. back. Moderat to severe atypia, close to margin. Excised 06/12/2015, margins free.  Marland Kitchen Dysplastic nevus 03/31/2018   Central upper abdomen. Moderate atypia, limited margins free.  . Frequent headaches   . Melanoma ([place]) 2010   left lower back  . Migraines     Family History: Family History  Problem Relation Age of Onset  . Hyperlipidemia Father   . Heart disease Father   . Diabetes Father   . Cancer Father        Skin Cancer  . Alcohol abuse Paternal Uncle   . Hyperlipidemia Paternal Uncle   . Heart disease Paternal Uncle   . Kidney disease Maternal Grandfather   . Hyperlipidemia Paternal Grandmother   . Heart disease Paternal Grandmother   . Drug abuse Paternal Grandmother   . Breast cancer Maternal Grandmother 70    Social History   Socioeconomic History  . Marital status: Single    Spouse name: Not on file  . Number of children: Not on file  . Years of education: Not on file  . Highest education level: Not on file  Occupational History  . Not on file  Tobacco Use  . Smoking status: Never Smoker  . Smokeless tobacco: Never Used  Substance  and Sexual Activity  . Alcohol use: Yes    Alcohol/week: 0.0 standard drinks    Comment: Rare   . Drug use: No  . Sexual activity: Yes    Partners: Male  Other Topics Concern  . Not on file  Social History Narrative   Divorced   Goes by [place]    2 children   Caffeine- No coffee, tea and soda daily 24 oz.     Social Determinants of Health   Financial Resource Strain: Not on file  Food Insecurity: Not on file  Transportation Needs: Not on file  Physical Activity: Not on file  Stress: Not on file  Social  Connections: Not on file  Intimate Partner Violence: Not on file      Review of Systems  Constitutional: Negative for chills, diaphoresis and fatigue.  HENT: Negative for ear pain, postnasal drip and sinus pressure.   Eyes: Negative for photophobia, discharge, redness, itching and visual disturbance.  Respiratory: Negative for cough, shortness of breath and wheezing.   Cardiovascular: Negative for chest pain, palpitations and leg swelling.  Gastrointestinal: Negative for abdominal pain, constipation, diarrhea, nausea and vomiting.  Genitourinary: Negative for dysuria and flank pain.  Musculoskeletal: Negative for arthralgias, back pain, gait problem and neck pain.  Skin: Negative for color change.  Allergic/Immunologic: Negative for environmental allergies and food allergies.  Neurological: Negative for dizziness and headaches.  Hematological: Does not bruise/bleed easily.  Psychiatric/Behavioral: Negative for agitation, behavioral problems (depression) and hallucinations.    Vital Signs: BP 133/84   Pulse 82   Temp 97.6 F (36.4 C)   Resp 16   Ht 5\' 6"  (1.676 m)   Wt 164 lb (74.4 kg)   SpO2 97%   BMI 26.47 kg/m    Physical Exam Vitals reviewed.  Constitutional:      Appearance: Normal appearance. She is normal weight.  Cardiovascular:     Rate and Rhythm: Normal rate and regular rhythm.     Pulses: Normal pulses.     Heart sounds: Normal heart sounds.  Pulmonary:     Effort: Pulmonary effort is normal.     Breath sounds: Normal breath sounds.  Abdominal:     General: Abdomen is flat.     Palpations: Abdomen is soft.  Musculoskeletal:        General: Normal range of motion.     Cervical back: Normal range of motion.  Skin:    General: Skin is warm.  Neurological:     General: No focal deficit present.     Mental Status: She is alert and oriented to person, place, and time. Mental status is at baseline.  Psychiatric:        Mood and Affect: Mood normal.         Behavior: Behavior normal.        Thought Content: Thought content normal.        Judgment: Judgment normal.    Assessment/Plan: 1. Elevated serum creatinine Needs to have labs repeated for further evaluation, ibuprofen as well as Meloxicam removed from medication list Will need further work-up if remains elevated  2. Attention deficit disorder (ADD) in adult Stable at this time, next refill needed consider lowering dose to minimize risks of adverse reactions to long standing use of stimulant therapy  General Counseling: liba hulsey understanding of the findings of todays visit and agrees with plan of treatment. I have discussed any further diagnostic evaluation that  may be needed or ordered today. We also reviewed her medications today. she has been encouraged to call the office with any questions or concerns that should arise related to todays visit.   Time spent: 30 Minutes Time spent includes review of chart, medications, test results and follow-up plan with the patient.  This patient was seen by Theodoro Grist AGNP-C in Collaboration with Dr Lavera Guise as a part of collaborative care agreement     Tanna Furry. Eureka Valdes AGNP-C Internal medicine

## 2020-12-07 NOTE — Telephone Encounter (Signed)
Patient did not check out after appointment 12/07/20. April Wilkins

## 2020-12-15 ENCOUNTER — Other Ambulatory Visit: Payer: Self-pay | Admitting: Internal Medicine

## 2020-12-19 LAB — BASIC METABOLIC PANEL
BUN/Creatinine Ratio: 29 — ABNORMAL HIGH (ref 12–28)
BUN: 23 mg/dL (ref 8–27)
CO2: 23 mmol/L (ref 20–29)
Calcium: 9.1 mg/dL (ref 8.7–10.3)
Chloride: 107 mmol/L — ABNORMAL HIGH (ref 96–106)
Creatinine, Ser: 0.8 mg/dL (ref 0.57–1.00)
GFR calc Af Amer: 91 mL/min/{1.73_m2} (ref >59)
GFR calc non Af Amer: 79 mL/min/{1.73_m2} (ref >59)
Glucose: 94 mg/dL (ref 65–99)
Potassium: 4.3 mmol/L (ref 3.5–5.2)
Sodium: 143 mmol/L (ref 134–144)

## 2020-12-20 ENCOUNTER — Other Ambulatory Visit: Payer: Self-pay

## 2020-12-20 ENCOUNTER — Ambulatory Visit: Payer: BC Managed Care – PPO | Admitting: Dermatology

## 2020-12-20 ENCOUNTER — Encounter: Payer: Self-pay | Admitting: Dermatology

## 2020-12-20 ENCOUNTER — Other Ambulatory Visit: Payer: Self-pay | Admitting: Dermatology

## 2020-12-20 DIAGNOSIS — Z85828 Personal history of other malignant neoplasm of skin: Secondary | ICD-10-CM

## 2020-12-20 DIAGNOSIS — Z1283 Encounter for screening for malignant neoplasm of skin: Secondary | ICD-10-CM

## 2020-12-20 DIAGNOSIS — L578 Other skin changes due to chronic exposure to nonionizing radiation: Secondary | ICD-10-CM

## 2020-12-20 DIAGNOSIS — D0462 Carcinoma in situ of skin of left upper limb, including shoulder: Secondary | ICD-10-CM | POA: Diagnosis not present

## 2020-12-20 DIAGNOSIS — L814 Other melanin hyperpigmentation: Secondary | ICD-10-CM

## 2020-12-20 DIAGNOSIS — D485 Neoplasm of uncertain behavior of skin: Secondary | ICD-10-CM

## 2020-12-20 DIAGNOSIS — B009 Herpesviral infection, unspecified: Secondary | ICD-10-CM

## 2020-12-20 DIAGNOSIS — Z85820 Personal history of malignant melanoma of skin: Secondary | ICD-10-CM | POA: Diagnosis not present

## 2020-12-20 DIAGNOSIS — L719 Rosacea, unspecified: Secondary | ICD-10-CM

## 2020-12-20 DIAGNOSIS — L821 Other seborrheic keratosis: Secondary | ICD-10-CM

## 2020-12-20 DIAGNOSIS — D225 Melanocytic nevi of trunk: Secondary | ICD-10-CM

## 2020-12-20 DIAGNOSIS — C4492 Squamous cell carcinoma of skin, unspecified: Secondary | ICD-10-CM

## 2020-12-20 DIAGNOSIS — D229 Melanocytic nevi, unspecified: Secondary | ICD-10-CM

## 2020-12-20 DIAGNOSIS — D1800 Hemangioma unspecified site: Secondary | ICD-10-CM

## 2020-12-20 DIAGNOSIS — Z86018 Personal history of other benign neoplasm: Secondary | ICD-10-CM | POA: Diagnosis not present

## 2020-12-20 HISTORY — DX: Squamous cell carcinoma of skin, unspecified: C44.92

## 2020-12-20 NOTE — Patient Instructions (Addendum)
Herpes Simplex Virus = Cold Sores = Fever Blisters is a chronic recurring blistering; scabbing sore-producing viral infection that is recurrent usually in the same area triggered by stress, sun/UV exposure and trauma.  It is infectious and can be spread from person to person by direct contact.  It is not curable, but is treatable with topical and oral medication.  Continue Valtrex 1 gram - take 2 pills by mouth at first onset of symptoms, then take 2 pills 12 hours later.    Rosacea is a chronic progressive skin condition usually affecting the face of adults, causing redness and/or acne bumps. It is treatable but not curable. It sometimes affects the eyes (ocular rosacea) as well. It may respond to topical and/or systemic medication and can flare with stress, sun exposure, alcohol, exercise and some foods.  Daily application of broad spectrum spf 30+ sunscreen to face is recommended to reduce flares.   Wound Care Instructions  1. Cleanse wound gently with soap and water once a day then pat dry with clean gauze. Apply a thing coat of Petrolatum (petroleum jelly, "Vaseline") over the wound (unless you have an allergy to this). We recommend that you use a new, sterile tube of Vaseline. Do not pick or remove scabs. Do not remove the yellow or white "healing tissue" from the base of the wound.  2. Cover the wound with fresh, clean, nonstick gauze and secure with paper tape. You may use Band-Aids in place of gauze and tape if the would is small enough, but would recommend trimming much of the tape off as there is often too much. Sometimes Band-Aids can irritate the skin.  3. You should call the office for your biopsy report after 1 week if you have not already been contacted.  4. If you experience any problems, such as abnormal amounts of bleeding, swelling, significant bruising, significant pain, or evidence of infection, please call the office immediately.

## 2020-12-20 NOTE — Progress Notes (Signed)
Follow-Up Visit   Subjective  April Wilkins is a 60 y.o. female who presents for the following: Annual Exam.  Patient presents today for TBSE. No new spots of concern today. She has a history of MM, BCC, SCC, and Dysplastic nevi. She also has a history of HSV, improved with Valtrex. She has Rosacea and uses metronidazole 0.75% cream prn, worse in summer.  The following portions of the chart were reviewed this encounter and updated as appropriate:       Review of Systems:  No other skin or systemic complaints except as noted in HPI or Assessment and Plan.  Objective  Well appearing patient in no apparent distress; mood and affect are within normal limits.  A full examination was performed including scalp, head, eyes, ears, nose, lips, neck, chest, axillae, abdomen, back, buttocks, bilateral upper extremities, bilateral lower extremities, hands, feet, fingers, toes, fingernails, and toenails. All findings within normal limits unless otherwise noted below.  Objective  Left Abdomen: 2.65mm medium dark brown macule  Spinal Upper Back: 5.60mm pink flesh papule  Objective  face: Telangiectasias of the cheeks; erythema of the cheeks and nose.  Objective  lips: Clear today.     Objective  Left Upper Arm: 2.43mm two-tone waxy brown macule        Assessment & Plan   Skin cancer screening performed today.  Actinic Damage - chronic, secondary to cumulative UV radiation exposure/sun exposure over time - diffuse scaly erythematous macules with underlying dyspigmentation - Recommend daily broad spectrum sunscreen SPF 30+ to sun-exposed areas, reapply every 2 hours as needed.  - Call for new or changing lesions.  Lentigines - Scattered tan macules - Discussed due to sun exposure - Benign, observe - Recommend daily broad spectrum sunscreen SPF 30+ to sun-exposed areas, reapply every 2 hours as needed. - Call for any changes  Seborrheic Keratoses - Stuck-on, waxy,  tan-brown papules and plaques  - Discussed benign etiology and prognosis. - Observe - Call for any changes  Melanocytic Nevi - Tan-brown and/or pink-flesh-colored symmetric macules and papules - Benign appearing on exam today - Observation - Call clinic for new or changing moles - Recommend daily use of broad spectrum spf 30+ sunscreen to sun-exposed areas.   History of Melanoma - No evidence of recurrence today of the left lower back - No lymphadenopathy - Recommend regular full body skin exams - Recommend daily broad spectrum sunscreen SPF 30+ to sun-exposed areas, reapply every 2 hours as needed.  - Call if any new or changing lesions are noted between office visits  History of Basal Cell Carcinoma of the Skin - No evidence of recurrence today of the right deltoid - Recommend regular full body skin exams - Recommend daily broad spectrum sunscreen SPF 30+ to sun-exposed areas, reapply every 2 hours as needed.  - Call if any new or changing lesions are noted between office visits  History of Squamous Cell Carcinoma of the Skin - No evidence of recurrence today - No lymphadenopathy - Recommend regular full body skin exams - Recommend daily broad spectrum sunscreen SPF 30+ to sun-exposed areas, reapply every 2 hours as needed.  - Call if any new or changing lesions are noted between office visits  History of Dysplastic Nevi - No evidence of recurrence today - Recommend regular full body skin exams - Recommend daily broad spectrum sunscreen SPF 30+ to sun-exposed areas, reapply every 2 hours as needed.  - Call if any new or changing lesions are noted between  office visits  Nevus (2) Left Abdomen; Spinal Upper Back  Benign-appearing.  Observation.  Call clinic for new or changing moles.  Recommend daily use of broad spectrum spf 30+ sunscreen to sun-exposed areas.    Rosacea face  Controlled Continue metronidazole 0.75% cream qd/bid prn.  Rosacea is a chronic  progressive skin condition usually affecting the face of adults, causing redness and/or acne bumps. It is treatable but not curable. It sometimes affects the eyes (ocular rosacea) as well. It may respond to topical and/or systemic medication and can flare with stress, sun exposure, alcohol, exercise and some foods.  Daily application of broad spectrum spf 30+ sunscreen to face is recommended to reduce flares.   Herpes simplex lips  Clear today. Controlled with Valtrex.  Continue Valtrex 1 gram 2 po at first onset of symptoms, then 2 po 12 hrs later. Pt has Rx at home.  Herpes Simplex Virus = Cold Sores = Fever Blisters is a chronic recurring blistering; scabbing sore-producing viral infection that is recurrent usually in the same area triggered by stress, sun/UV exposure and trauma.  It is infectious and can be spread from person to person by direct contact.  It is not curable, but is treatable with topical and oral medication.  Neoplasm of uncertain behavior of skin Left Upper Arm  Skin / nail biopsy Type of biopsy: tangential   Informed consent: discussed and consent obtained   Patient was prepped and draped in usual sterile fashion: Area prepped with alcohol. Anesthesia: the lesion was anesthetized in a standard fashion   Anesthetic:  1% lidocaine w/ epinephrine 1-100,000 buffered w/ 8.4% NaHCO3 Instrument used: flexible razor blade   Hemostasis achieved with: pressure, aluminum chloride and electrodesiccation   Outcome: patient tolerated procedure well   Post-procedure details: wound care instructions given   Post-procedure details comment:  Ointment and small bandage applied  Specimen 1 - Surgical pathology Differential Diagnosis: SK vs Nevus r/o Atypia 2.58mm two-tone waxy brown macule Check Margins: No   Hemangiomas - Red papules - Discussed benign nature - Observe - Call for any changes   Return in about 1 year (around 12/20/2021) for TBSE.   IJamesetta Orleans, CMA, am  acting as scribe for Brendolyn Patty, MD .  Documentation: I have reviewed the above documentation for accuracy and completeness, and I agree with the above.  Brendolyn Patty MD

## 2020-12-21 NOTE — Progress Notes (Signed)
Labs reviewed and will be dicussed on next visit

## 2020-12-25 ENCOUNTER — Telehealth: Payer: Self-pay

## 2020-12-25 NOTE — Telephone Encounter (Signed)
Advised pt of bx results.  Scheduled pt for EDC of SCCIS or L upper arm./sh

## 2020-12-25 NOTE — Telephone Encounter (Signed)
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

## 2021-01-10 ENCOUNTER — Other Ambulatory Visit: Payer: Self-pay

## 2021-01-10 ENCOUNTER — Ambulatory Visit: Payer: BC Managed Care – PPO | Admitting: Hospice and Palliative Medicine

## 2021-01-10 ENCOUNTER — Encounter: Payer: Self-pay | Admitting: Hospice and Palliative Medicine

## 2021-01-10 VITALS — BP 124/76 | HR 96 | Temp 97.5°F | Resp 16 | Ht 67.0 in | Wt 161.4 lb

## 2021-01-10 DIAGNOSIS — M545 Low back pain, unspecified: Secondary | ICD-10-CM

## 2021-01-10 DIAGNOSIS — Z79899 Other long term (current) drug therapy: Secondary | ICD-10-CM | POA: Diagnosis not present

## 2021-01-10 DIAGNOSIS — R0600 Dyspnea, unspecified: Secondary | ICD-10-CM

## 2021-01-10 DIAGNOSIS — R300 Dysuria: Secondary | ICD-10-CM

## 2021-01-10 DIAGNOSIS — F988 Other specified behavioral and emotional disorders with onset usually occurring in childhood and adolescence: Secondary | ICD-10-CM

## 2021-01-10 LAB — POCT URINE DRUG SCREEN
Methylenedioxyamphetamine: NOT DETECTED
POC Amphetamine UR: POSITIVE — AB
POC BENZODIAZEPINES UR: NOT DETECTED
POC Barbiturate UR: NOT DETECTED
POC Cocaine UR: NOT DETECTED
POC Ecstasy UR: NOT DETECTED
POC Marijuana UR: NOT DETECTED
POC Methadone UR: NOT DETECTED
POC Methamphetamine UR: NOT DETECTED
POC Opiate Ur: NOT DETECTED
POC Oxycodone UR: NOT DETECTED
POC PHENCYCLIDINE UR: NOT DETECTED
POC TRICYCLICS UR: NOT DETECTED

## 2021-01-10 LAB — POCT URINALYSIS DIPSTICK
Bilirubin, UA: NEGATIVE
Blood, UA: NEGATIVE
Glucose, UA: NEGATIVE
Leukocytes, UA: NEGATIVE
Nitrite, UA: POSITIVE
Protein, UA: POSITIVE — AB
Spec Grav, UA: 1.02 (ref 1.010–1.025)
Urobilinogen, UA: 0.2 E.U./dL
pH, UA: 5 (ref 5.0–8.0)

## 2021-01-10 MED ORDER — AMPHETAMINE-DEXTROAMPHET ER 20 MG PO CP24: 20.0000 mg | ORAL_CAPSULE | Freq: Every day | ORAL | 0 refills | Status: DC

## 2021-01-10 MED ORDER — MELOXICAM 7.5 MG PO TABS: 7.5000 mg | ORAL_TABLET | Freq: Every day | ORAL | 0 refills | Status: DC

## 2021-01-10 NOTE — Progress Notes (Signed)
[facility] [place], [place] [address]  Internal MEDICINE  Office Visit Note  Patient Name: April Wilkins  [id number]  [id number]  Date of Service: 01/11/2021  Chief Complaint  Patient presents with  . Follow-up    Refill request, review blood work, lower back hurts/aches, started 2 months ago, had surgery on lower back about 4 years ago but pain is different from them, paperwork    HPI Patient is here for routine follow-up Reviewed repeat labs due to abnormal kidney function--creatinine and GFR have returned to normal Unaware of any changes that would have caused AKI--Meloxicam and ibuprofen discontinued but she was not taking that medication to begin with just happened to be on her medication list Has increased her water intake since last labs were checked  Having some pain//aching in her lower back--history of lumbar surgery about 4 years ago, pain initially started about 2 months ago, has been very active with her grandchild, constantly bending over and lifting Pain is mild, no radiation down legs, not currently affecting ADLs  Continues to take Adderall to help with concentration during work days--does not take medication on the weekends Has taken Adderall for over 5 years, no echocardiogram or sleep study on file  Denies any negative side effects from Adderall Sleeps well most nights but this is not in relation to days she does or does not take medication Denies chest pain or palpitations  Current Medication: Outpatient Encounter Medications as of 01/10/2021  Medication Sig  . acetaminophen (TYLENOL) 325 MG tablet Take by mouth.  Marland Kitchen amphetamine-dextroamphetamine (ADDERALL XR) 20 MG 24 hr capsule Take 1 capsule (20 mg total) by mouth daily.  . meloxicam (MOBIC) 7.5 MG tablet Take 1 tablet (7.5 mg total) by mouth daily.  . [DISCONTINUED] ADDERALL XR 30 MG 24 hr capsule Take 1 capsule (30 mg total) by mouth daily.   No facility-administered  encounter medications on file as of 01/10/2021.    Surgical History: Past Surgical History:  Procedure Laterality Date  . SKIN BIOPSY      Medical History: Past Medical History:  Diagnosis Date  . Actinic keratosis 08/31/2010   Right forearm. Pigmented.  . Allergy    Seasonal  . Basal cell carcinoma (BCC) of right upper arm 02/08/2010   Right deltoid. Superficial.   . Chicken pox   . Dysplastic nevus 02/08/2010   R mid back bra line. Moderate to severe atypia, close to margin. Excised 03/28/2010, margins free.  Marland Kitchen Dysplastic nevus 02/08/2010   Left mid to low back above MM site. Moderate atypia, close to margin  . Dysplastic nevus 02/08/2010   Left sup. lat. buttocks near side. Moderate atypia, close to margin.  Marland Kitchen Dysplastic nevus 05/22/2015   Left lower lat. back. Moderat to severe atypia, close to margin. Excised 06/12/2015, margins free.  Marland Kitchen Dysplastic nevus 03/31/2018   Central upper abdomen. Moderate atypia, limited margins free.  . Frequent headaches   . Melanoma ([place]) 2010   left lower back  . Migraines   . Squamous cell carcinoma of skin    back  . Squamous cell carcinoma of skin 12/20/2020   L upper arm, SCC IS    Family History: Family History  Problem Relation Age of Onset  . Hyperlipidemia Father   . Heart disease Father   . Diabetes Father   . Cancer Father        Skin Cancer  . Alcohol abuse Paternal Uncle   . Hyperlipidemia Paternal Uncle   .  Heart disease Paternal Uncle   . Kidney disease Maternal Grandfather   . Hyperlipidemia Paternal Grandmother   . Heart disease Paternal Grandmother   . Drug abuse Paternal Grandmother   . Breast cancer Maternal Grandmother 70    Social History   Socioeconomic History  . Marital status: Single    Spouse name: Not on file  . Number of children: Not on file  . Years of education: Not on file  . Highest education level: Not on file  Occupational History  . Not on file  Tobacco Use  . Smoking status: Never  Smoker  . Smokeless tobacco: Never Used  Substance and Sexual Activity  . Alcohol use: Yes    Alcohol/week: 0.0 standard drinks    Comment: Rare   . Drug use: No  . Sexual activity: Yes    Partners: Male  Other Topics Concern  . Not on file  Social History Narrative   Divorced   Goes by [place]    2 children   Caffeine- No coffee, tea and soda daily 24 oz.     Social Determinants of Health   Financial Resource Strain: Not on file  Food Insecurity: Not on file  Transportation Needs: Not on file  Physical Activity: Not on file  Stress: Not on file  Social Connections: Not on file  Intimate Partner Violence: Not on file      Review of Systems  Constitutional: Negative for chills, diaphoresis and fatigue.  HENT: Negative for ear pain, postnasal drip and sinus pressure.   Eyes: Negative for photophobia, discharge, redness, itching and visual disturbance.  Respiratory: Negative for cough, shortness of breath and wheezing.   Cardiovascular: Negative for chest pain, palpitations and leg swelling.  Gastrointestinal: Negative for abdominal pain, constipation, diarrhea, nausea and vomiting.  Genitourinary: Negative for dysuria and flank pain.  Musculoskeletal: Positive for back pain. Negative for arthralgias, gait problem and neck pain.  Skin: Negative for color change.  Allergic/Immunologic: Negative for environmental allergies and food allergies.  Neurological: Negative for dizziness and headaches.  Hematological: Does not bruise/bleed easily.  Psychiatric/Behavioral: Negative for agitation, behavioral problems (depression) and hallucinations.    Vital Signs: BP 124/76   Pulse 96   Temp (!) 97.5 F (36.4 C)   Resp 16   Ht 5\' 7"  (1.702 m)   Wt 161 lb 6.4 oz (73.2 kg)   SpO2 98%   BMI 25.28 kg/m    Physical Exam Vitals reviewed.  Constitutional:      Appearance: Normal appearance. She is normal weight.  Cardiovascular:      Rate and Rhythm: Normal rate and regular rhythm.     Pulses: Normal pulses.     Heart sounds: Normal heart sounds.  Pulmonary:     Effort: Pulmonary effort is normal.     Breath sounds: Normal breath sounds.  Abdominal:     General: Abdomen is flat.     Palpations: Abdomen is soft.  Musculoskeletal:        General: Normal range of motion.     Cervical back: Normal range of motion.  Skin:    General: Skin is warm.  Neurological:     General: No focal deficit present.     Mental Status: She is alert and oriented to person, place, and time. Mental status is at baseline.  Psychiatric:        Mood and Affect: Mood normal.  Behavior: Behavior normal.        Thought Content: Thought content normal.        Judgment: Judgment normal.    Assessment/Plan: 1. Acute midline low back pain without sciatica Start back low dose Meloxicam for back pain Will closely monitor kidney function--unlikely the cause of elevation as she was not taking medication at that ime - meloxicam (MOBIC) 7.5 MG tablet; Take 1 tablet (7.5 mg total) by mouth daily.  Dispense: 30 tablet; Refill: 0  2. Dyspnea, unspecified type Due to long term use of stimulant therapy in advanced age - ECHOCARDIOGRAM COMPLETE; Future  3. Attention deficit disorder (ADD) in adult Lowered dose of Adderall to 20 mg XR--will continue to slowly work on weaning dose down Encouraged to only take on work days and avoid use on off days and weekends - amphetamine-dextroamphetamine (ADDERALL XR) 20 MG 24 hr capsule; Take 1 capsule (20 mg total) by mouth daily.  Dispense: 30 capsule; Refill: 0  4. Encounter for long-term (current) use of high-risk medication - POCT Urine Drug Screen - ECHOCARDIOGRAM COMPLETE; Future  5. Dysuria - POCT Urinalysis Dipstick - CULTURE, URINE COMPREHENSIVE  General Counseling: Niaya verbalizes understanding of the findings of todays visit and agrees with plan of treatment. I have discussed any  further diagnostic evaluation that may be needed or ordered today. We also reviewed her medications today. she has been encouraged to call the office with any questions or concerns that should arise related to todays visit.    Orders Placed This Encounter  Procedures  . CULTURE, URINE COMPREHENSIVE  . POCT Urinalysis Dipstick  . POCT Urine Drug Screen  . ECHOCARDIOGRAM COMPLETE    Meds ordered this encounter  Medications  . amphetamine-dextroamphetamine (ADDERALL XR) 20 MG 24 hr capsule    Sig: Take 1 capsule (20 mg total) by mouth daily.    Dispense:  30 capsule    Refill:  0  . meloxicam (MOBIC) 7.5 MG tablet    Sig: Take 1 tablet (7.5 mg total) by mouth daily.    Dispense:  30 tablet    Refill:  0    Time spent: 30 Minutes Time spent includes review of chart, medications, test results and follow-up plan with the patient.  This patient was seen by Theodoro Grist AGNP-C in Collaboration with Dr Lavera Guise as a part of collaborative care agreement     Tanna Furry. Harris AGNP-C Internal medicine

## 2021-01-11 ENCOUNTER — Encounter: Payer: Self-pay | Admitting: Hospice and Palliative Medicine

## 2021-01-12 ENCOUNTER — Other Ambulatory Visit: Payer: Self-pay | Admitting: Hospice and Palliative Medicine

## 2021-01-12 LAB — CULTURE, URINE COMPREHENSIVE

## 2021-01-12 MED ORDER — NITROFURANTOIN MONOHYD MACRO 100 MG PO CAPS: 100.0000 mg | ORAL_CAPSULE | Freq: Two times a day (BID) | ORAL | 0 refills | Status: DC

## 2021-01-17 ENCOUNTER — Other Ambulatory Visit: Payer: Self-pay

## 2021-01-17 ENCOUNTER — Ambulatory Visit: Payer: BC Managed Care – PPO | Admitting: Dermatology

## 2021-01-17 ENCOUNTER — Encounter: Payer: Self-pay | Admitting: Dermatology

## 2021-01-17 DIAGNOSIS — L578 Other skin changes due to chronic exposure to nonionizing radiation: Secondary | ICD-10-CM

## 2021-01-17 DIAGNOSIS — D0462 Carcinoma in situ of skin of left upper limb, including shoulder: Secondary | ICD-10-CM | POA: Diagnosis not present

## 2021-01-17 NOTE — Progress Notes (Signed)
   Follow-Up Visit   Subjective  April Wilkins is a 59 y.o. female who presents for the following: Skin Cancer (Here for treatment of SCCis of left upper arm. Bx: 12/20/2020. Healing well, per patient.).    The following portions of the chart were reviewed this encounter and updated as appropriate:      Review of Systems: No other skin or systemic complaints except as noted in HPI or Assessment and Plan.  Objective  Well appearing patient in no apparent distress; mood and affect are within normal limits.  A focused examination was performed including face, arms. Relevant physical exam findings are noted in the Assessment and Plan.  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 Left Upper Arm  Destruction of lesion  Destruction method: electrodesiccation and curettage   Timeout:  patient name, date of birth, surgical site, and procedure verified Anesthesia: the lesion was anesthetized in a standard fashion   Anesthetic:  1% lidocaine w/ epinephrine 1-100,000 buffered w/ 8.4% NaHCO3 Curettage performed in three different directions: Yes   Electrodesiccation performed over the curetted area: Yes   Lesion length (cm):  0.4 Lesion width (cm):  0.4 Margin per side (cm):  0.1 Final wound size (cm):  0.6 Hemostasis achieved with:  pressure, aluminum chloride and electrodesiccation Outcome: patient tolerated procedure well with no complications   Post-procedure details: wound care instructions given   Additional details:  Mupirocin ointment and Bandaid applied    Actinic Damage - chronic, secondary to cumulative UV radiation exposure/sun exposure over time - diffuse scaly erythematous macules with underlying dyspigmentation - Recommend daily broad spectrum sunscreen SPF 30+ to sun-exposed areas, reapply every 2 hours as needed.  - Recommend staying in the shade or wearing long sleeves, sun  glasses (UVA+UVB protection) and wide brim hats (4-inch brim around the entire circumference of the hat). - Call for new or changing lesions.  Return in about 6 months (around 07/20/2021) for [facility] recheck.   I, Emelia Salisbury, CMA, am acting as scribe for Brendolyn Patty, MD.  Documentation: I have reviewed the above documentation for accuracy and completeness, and I agree with the above.  Brendolyn Patty MD

## 2021-01-17 NOTE — Patient Instructions (Signed)

## 2021-01-31 ENCOUNTER — Other Ambulatory Visit: Payer: BC Managed Care – PPO

## 2021-02-14 ENCOUNTER — Telehealth: Payer: Self-pay

## 2021-02-14 NOTE — Telephone Encounter (Signed)
Called lmom for patient to call office to reschedule cancelled ultrasound. April Wilkins

## 2021-02-16 ENCOUNTER — Encounter: Payer: Self-pay | Admitting: Hospice and Palliative Medicine

## 2021-02-16 NOTE — Telephone Encounter (Signed)
Please see

## 2021-03-15 ENCOUNTER — Ambulatory Visit: Payer: BC Managed Care – PPO | Admitting: Hospice and Palliative Medicine

## 2021-03-21 ENCOUNTER — Ambulatory Visit: Payer: BC Managed Care – PPO

## 2021-03-21 ENCOUNTER — Other Ambulatory Visit: Payer: Self-pay

## 2021-03-21 DIAGNOSIS — Z79899 Other long term (current) drug therapy: Secondary | ICD-10-CM

## 2021-03-21 DIAGNOSIS — R0602 Shortness of breath: Secondary | ICD-10-CM | POA: Diagnosis not present

## 2021-03-21 DIAGNOSIS — R0600 Dyspnea, unspecified: Secondary | ICD-10-CM

## 2021-03-26 ENCOUNTER — Other Ambulatory Visit: Payer: Self-pay

## 2021-03-26 ENCOUNTER — Encounter: Payer: Self-pay | Admitting: Nurse Practitioner

## 2021-03-26 ENCOUNTER — Ambulatory Visit: Payer: BC Managed Care – PPO | Admitting: Nurse Practitioner

## 2021-03-26 VITALS — BP 146/92 | HR 79 | Temp 97.1°F | Resp 16 | Ht 66.5 in | Wt 166.2 lb

## 2021-03-26 DIAGNOSIS — M545 Low back pain, unspecified: Secondary | ICD-10-CM

## 2021-03-26 DIAGNOSIS — F988 Other specified behavioral and emotional disorders with onset usually occurring in childhood and adolescence: Secondary | ICD-10-CM | POA: Diagnosis not present

## 2021-03-26 DIAGNOSIS — R030 Elevated blood-pressure reading, without diagnosis of hypertension: Secondary | ICD-10-CM

## 2021-03-26 MED ORDER — AMPHETAMINE-DEXTROAMPHET ER 20 MG PO CP24: 20.0000 mg | ORAL_CAPSULE | Freq: Every day | ORAL | 0 refills | Status: DC

## 2021-03-26 MED ORDER — AMPHETAMINE-DEXTROAMPHET ER 20 MG PO CP24
20.0000 mg | ORAL_CAPSULE | Freq: Every day | ORAL | 0 refills | Status: DC
Start: 2021-04-23 — End: 2021-06-26

## 2021-03-26 NOTE — Progress Notes (Signed)
[facility] [place],  [address]  Internal MEDICINE  Office Visit Note  Patient Name: April Wilkins  [id number]  [id number]  Date of Service: 03/26/2021  Chief Complaint  Patient presents with  . Follow-up    [place] results, refill request  . Allergies    HPI April Wilkins presents for a follow up visit regarding ADHD. Pt denies having any problem with allergies. -seen in March 2022 for low back pain w/o sciatica. meloxicam 7.5 mg was prescribed.  -Her adderall was also decreased at this visit. 20mg  adderall xr working for her, 30 mg was too much. Needs generic ordered, insurance will not cover brand name Discussed echo with patient: EF 72%, right atrium slightly dilated, left atrium borderline dilated. No intervention needed. Repeat echo as needed.  Blood pressure slightly elevated today. 146/92. No history of hypertension.  Recheck 135/95    Current Medication: Outpatient Encounter Medications as of 03/26/2021  Medication Sig  . acetaminophen (TYLENOL) 325 MG tablet Take by mouth.  Derrill Memo ON 04/23/2021] amphetamine-dextroamphetamine (ADDERALL XR) 20 MG 24 hr capsule Take 1 capsule (20 mg total) by mouth daily.  . meloxicam (MOBIC) 7.5 MG tablet Take 1 tablet (7.5 mg total) by mouth daily.  . [DISCONTINUED] amphetamine-dextroamphetamine (ADDERALL XR) 20 MG 24 hr capsule Take 1 capsule (20 mg total) by mouth daily.  . [DISCONTINUED] nitrofurantoin, macrocrystal-monohydrate, (MACROBID) 100 MG capsule Take 1 capsule (100 mg total) by mouth 2 (two) times daily.  Marland Kitchen amphetamine-dextroamphetamine (ADDERALL XR) 20 MG 24 hr capsule Take 1 capsule (20 mg total) by mouth daily.   No facility-administered encounter medications on file as of 03/26/2021.    Surgical History: Past Surgical History:  Procedure Laterality Date  . SKIN BIOPSY      Medical History: Past Medical History:  Diagnosis Date  . Actinic keratosis 08/31/2010   Right forearm. Pigmented.   . Allergy    Seasonal  . Basal cell carcinoma (BCC) of right upper arm 02/08/2010   Right deltoid. Superficial.   . Chicken pox   . Dysplastic nevus 02/08/2010   R mid back bra line. Moderate to severe atypia, close to margin. Excised 03/28/2010, margins free.  Marland Kitchen Dysplastic nevus 02/08/2010   Left mid to low back above MM site. Moderate atypia, close to margin  . Dysplastic nevus 02/08/2010   Left sup. lat. buttocks near side. Moderate atypia, close to margin.  Marland Kitchen Dysplastic nevus 05/22/2015   Left lower lat. back. Moderat to severe atypia, close to margin. Excised 06/12/2015, margins free.  Marland Kitchen Dysplastic nevus 03/31/2018   Central upper abdomen. Moderate atypia, limited margins free.  . Frequent headaches   . Melanoma ([place]) 2010   left lower back  . Migraines   . Squamous cell carcinoma of skin    back  . Squamous cell carcinoma of skin 12/20/2020   L upper arm, SCC IS, EDC 01/17/21    Family History: Family History  Problem Relation Age of Onset  . Hyperlipidemia Father   . Heart disease Father   . Diabetes Father   . Cancer Father        Skin Cancer  . Alcohol abuse Paternal Uncle   . Hyperlipidemia Paternal Uncle   . Heart disease Paternal Uncle   . Kidney disease Maternal Grandfather   . Hyperlipidemia Paternal Grandmother   . Heart disease Paternal Grandmother   . Drug abuse Paternal Grandmother   . Breast cancer Maternal Grandmother 70    Social History  Socioeconomic History  . Marital status: Single    Spouse name: Not on file  . Number of children: Not on file  . Years of education: Not on file  . Highest education level: Not on file  Occupational History  . Not on file  Tobacco Use  . Smoking status: Never Smoker  . Smokeless tobacco: Never Used  Substance and Sexual Activity  . Alcohol use: Yes    Alcohol/week: 0.0 standard drinks    Comment: Rare   . Drug use: No  . Sexual activity: Yes    Partners: Male  Other Topics Concern  . Not on file   Social History Narrative   Divorced   Goes by April Wilkins    2 children   Caffeine- No coffee, tea and soda daily 24 oz.     Social Determinants of Health   Financial Resource Strain: Not on file  Food Insecurity: Not on file  Transportation Needs: Not on file  Physical Activity: Not on file  Stress: Not on file  Social Connections: Not on file  Intimate Partner Violence: Not on file      Review of Systems  Constitutional: Negative.   Respiratory: Negative.   Cardiovascular: Negative.   Allergic/Immunologic: Negative.   Psychiatric/Behavioral: The patient is nervous/anxious.     Vital Signs: BP (!) 146/92   Pulse 79   Temp (!) 97.1 F (36.2 C)   Resp 16   Ht 5' 6.5" (1.689 m)   Wt 166 lb 3.2 oz (75.4 kg)   SpO2 97%   BMI 26.42 kg/m    Physical Exam Vitals reviewed.  Constitutional:      Appearance: She is overweight.  HENT:     Head: Normocephalic and atraumatic.  Cardiovascular:     Rate and Rhythm: Normal rate and regular rhythm.     Pulses: Normal pulses.     Heart sounds: Normal heart sounds.  Pulmonary:     Effort: Pulmonary effort is normal.     Breath sounds: Normal breath sounds.  Skin:    General: Skin is warm and dry.  Neurological:     Mental Status: She is alert and oriented to person, place, and time.  Psychiatric:        Mood and Affect: Mood normal.        Behavior: Behavior normal.        Assessment/Plan: 1. Attention deficit disorder (ADD) in adult Current dose working well for patient, refills ordered, follow up in 3 months.  - amphetamine-dextroamphetamine (ADDERALL XR) 20 MG 24 hr capsule; Take 1 capsule (20 mg total) by mouth daily.  Dispense: 30 capsule; Refill: 0  2. Acute midline low back pain without sciatica meloxicam 7.5 mg has been helping with low back pain. If low back pain worsens, pt instructed to call the clinic and meloxicam can be increased to 15 mg daily.   3.  Elevated blood-pressure reading without diagnosis of hypertension April Wilkins does not have hypertension. Blood pressure was 146/92 today, and 135/95 when manually rechecked. She reports feels anxious regarding her appointment today. Discussed monitoring blood pressures at home. She states she can get it checked at work. Requested that Alyne call the clinic if she has elevated blood pressure readings when she has it checked at work. Will recheck BP at next follow up visit.    General Counseling: saidi santacroce understanding of the findings of todays visit and agrees with plan of treatment.  I have discussed any further diagnostic evaluation that may be needed or ordered today. We also reviewed her medications today. she has been encouraged to call the office with any questions or concerns that should arise related to todays visit.    No orders of the defined types were placed in this encounter.   Meds ordered this encounter  Medications  . amphetamine-dextroamphetamine (ADDERALL XR) 20 MG 24 hr capsule    Sig: Take 1 capsule (20 mg total) by mouth daily.    Dispense:  30 capsule    Refill:  0    Please use generic form only. Patient reports that her insurance will not cover brand name.  Marland Kitchen amphetamine-dextroamphetamine (ADDERALL XR) 20 MG 24 hr capsule    Sig: Take 1 capsule (20 mg total) by mouth daily.    Dispense:  30 capsule    Refill:  0    Please use generic form only Do not fill before 04/23/21.    Order Specific Question:   Supervising Provider    Answer:   Lavera Guise [[id number]]   Return in about 3 months (around 06/26/2021) for F/U, ADHD med check, [place] PCP.  Total time spent:30 Minutes Time spent includes review of chart, medications, test results, and follow up plan with the patient.    Controlled Substance Database was reviewed by me.  This patient was seen by Jonetta Osgood, FNP-C in collaboration with Dr. Clayborn Bigness as a part of collaborative care agreement.  Dr Lavera Guise Internal medicine

## 2021-06-26 ENCOUNTER — Encounter: Payer: Self-pay | Admitting: Nurse Practitioner

## 2021-06-26 ENCOUNTER — Ambulatory Visit: Payer: BC Managed Care – PPO | Admitting: Nurse Practitioner

## 2021-06-26 ENCOUNTER — Other Ambulatory Visit: Payer: Self-pay

## 2021-06-26 VITALS — BP 145/100 | HR 70 | Temp 97.4°F | Resp 16 | Ht 66.5 in | Wt 167.6 lb

## 2021-06-26 DIAGNOSIS — I10 Essential (primary) hypertension: Secondary | ICD-10-CM

## 2021-06-26 DIAGNOSIS — M5442 Lumbago with sciatica, left side: Secondary | ICD-10-CM

## 2021-06-26 DIAGNOSIS — G8929 Other chronic pain: Secondary | ICD-10-CM

## 2021-06-26 DIAGNOSIS — Z79899 Other long term (current) drug therapy: Secondary | ICD-10-CM | POA: Diagnosis not present

## 2021-06-26 DIAGNOSIS — F988 Other specified behavioral and emotional disorders with onset usually occurring in childhood and adolescence: Secondary | ICD-10-CM | POA: Diagnosis not present

## 2021-06-26 DIAGNOSIS — M5441 Lumbago with sciatica, right side: Secondary | ICD-10-CM

## 2021-06-26 LAB — POCT URINE DRUG SCREEN
Methylenedioxyamphetamine: NOT DETECTED
POC Amphetamine UR: POSITIVE — AB
POC BENZODIAZEPINES UR: NOT DETECTED
POC Barbiturate UR: NOT DETECTED
POC Cocaine UR: NOT DETECTED
POC Ecstasy UR: NOT DETECTED
POC Marijuana UR: NOT DETECTED
POC Methadone UR: NOT DETECTED
POC Methamphetamine UR: NOT DETECTED
POC Opiate Ur: NOT DETECTED
POC Oxycodone UR: NOT DETECTED
POC PHENCYCLIDINE UR: NOT DETECTED
POC TRICYCLICS UR: NOT DETECTED

## 2021-06-26 MED ORDER — MELOXICAM 15 MG PO TABS: 15.0000 mg | ORAL_TABLET | Freq: Every day | ORAL | 0 refills | Status: DC

## 2021-06-26 MED ORDER — AMLODIPINE BESYLATE 2.5 MG PO TABS: 2.5000 mg | ORAL_TABLET | Freq: Every day | ORAL | 0 refills | Status: DC

## 2021-06-26 MED ORDER — AMPHETAMINE-DEXTROAMPHET ER 30 MG PO CP24: 30.0000 mg | ORAL_CAPSULE | ORAL | 0 refills | Status: DC

## 2021-06-26 NOTE — Progress Notes (Signed)
[facility] [place], [place] [address]  Internal MEDICINE  Office Visit Note  Patient Name: April Wilkins  [id number]  [id number]  Date of Service: 06/26/2021  Chief Complaint  Patient presents with   Follow-up    Refill request    ADHD   Quality Metric Gaps    Shingrix was done within the last 6 months, pt will call us with dates     HPI April Wilkins presents for follow up visit for ADHD and medication refills. She has had 2 doses of the COVID vaccine and 2 doses of the shingles vaccine. Her blood pressure is significantly elevated today. At her previous office visit, she mentioned that her blood pressure is always elevated when she comes to the clinic. In reviewing her chart, her blood pressure has been consistently elevated during several office visits dating as far back as the year 2020. Hypertension and blood pressure medication discussed with patient and she is agreeable to start medication to get her blood pressure under control.  At her last office visit, she requested a decrease in her ADHD medication, over the past 3 months she has felt more scatter-brained and distracted and is now requesting to go back to her original dose of 30 mg daily of the extended release generic adderall.  UDS done in office today.   Current Medication: Outpatient Encounter Medications as of 06/26/2021  Medication Sig   amLODipine (NORVASC) 2.5 MG tablet Take 1 tablet (2.5 mg total) by mouth daily.   amphetamine-dextroamphetamine (ADDERALL XR) 30 MG 24 hr capsule Take 1 capsule (30 mg total) by mouth every morning.   [START ON 07/24/2021] amphetamine-dextroamphetamine (ADDERALL XR) 30 MG 24 hr capsule Take 1 capsule (30 mg total) by mouth every morning.   [START ON 08/21/2021] amphetamine-dextroamphetamine (ADDERALL XR) 30 MG 24 hr capsule Take 1 capsule (30 mg total) by mouth every morning.   meloxicam (MOBIC) 15 MG tablet Take 1 tablet (15 mg total) by mouth daily.    [DISCONTINUED] amphetamine-dextroamphetamine (ADDERALL XR) 20 MG 24 hr capsule Take 1 capsule (20 mg total) by mouth daily.   [DISCONTINUED] meloxicam (MOBIC) 7.5 MG tablet Take 1 tablet (7.5 mg total) by mouth daily.   [DISCONTINUED] acetaminophen (TYLENOL) 325 MG tablet Take by mouth. (Patient not taking: Reported on 06/26/2021)   [DISCONTINUED] amphetamine-dextroamphetamine (ADDERALL XR) 20 MG 24 hr capsule Take 1 capsule (20 mg total) by mouth daily. (Patient not taking: Reported on 06/26/2021)   No facility-administered encounter medications on file as of 06/26/2021.    Surgical History: Past Surgical History:  Procedure Laterality Date   SKIN BIOPSY      Medical History: Past Medical History:  Diagnosis Date   Actinic keratosis 08/31/2010   Right forearm. Pigmented.   Allergy    Seasonal   Basal cell carcinoma (BCC) of right upper arm 02/08/2010   Right deltoid. Superficial.    Chicken pox    Dysplastic nevus 02/08/2010   R mid back bra line. Moderate to severe atypia, close to margin. Excised 03/28/2010, margins free.   Dysplastic nevus 02/08/2010   Left mid to low back above MM site. Moderate atypia, close to margin   Dysplastic nevus 02/08/2010   Left sup. lat. buttocks near side. Moderate atypia, close to margin.   Dysplastic nevus 05/22/2015   Left lower lat. back. Moderat to severe atypia, close to margin. Excised 06/12/2015, margins free.   Dysplastic nevus 03/31/2018   Central upper abdomen. Moderate atypia, limited margins free.  Frequent headaches    Melanoma ([place]) 2010   left lower back   Migraines    Squamous cell carcinoma of skin    back   Squamous cell carcinoma of skin 12/20/2020   L upper arm, SCC IS, EDC 01/17/21    Family History: Family History  Problem Relation Age of Onset   Hyperlipidemia Father    Heart disease Father    Diabetes Father    Cancer Father        Skin Cancer   Alcohol abuse Paternal Uncle    Hyperlipidemia Paternal Uncle     Heart disease Paternal Uncle    Kidney disease Maternal Grandfather    Hyperlipidemia Paternal Grandmother    Heart disease Paternal Grandmother    Drug abuse Paternal Grandmother    Breast cancer Maternal Grandmother 70    Social History   Socioeconomic History   Marital status: Single    Spouse name: Not on file   Number of children: Not on file   Years of education: Not on file   Highest education level: Not on file  Occupational History   Not on file  Tobacco Use   Smoking status: Never   Smokeless tobacco: Never  Substance and Sexual Activity   Alcohol use: Yes    Alcohol/week: 0.0 standard drinks    Comment: Rare    Drug use: No   Sexual activity: Yes    Partners: Male  Other Topics Concern   Not on file  Social History Narrative   Divorced   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     Social Determinants of Health   Financial Resource Strain: Not on file  Food Insecurity: Not on file  Transportation Needs: Not on file  Physical Activity: Not on file  Stress: Not on file  Social Connections: Not on file  Intimate Partner Violence: Not on file      Review of Systems  Constitutional:  Negative for chills, fatigue and unexpected weight change.  HENT:  Negative for congestion, postnasal drip, rhinorrhea, sneezing and sore throat.   Eyes:  Negative for redness.  Respiratory:  Negative for cough, chest tightness and shortness of breath.   Cardiovascular:  Negative for chest pain and palpitations.  Gastrointestinal:  Negative for abdominal pain, constipation, diarrhea, nausea and vomiting.  Genitourinary:  Negative for dysuria and frequency.  Musculoskeletal:  Negative for arthralgias, back pain, joint swelling and neck pain.  Skin:  Negative for rash.  Neurological: Negative.  Negative for tremors and numbness.  Hematological:  Negative for adenopathy. Does not bruise/bleed  easily.  Psychiatric/Behavioral:  Negative for behavioral problems (Depression), sleep disturbance and suicidal ideas. The patient is not nervous/anxious.    Vital Signs: BP (!) 145/100 Comment: 152/87  Pulse 70   Temp (!) 97.4 F (36.3 C)   Resp 16   Ht 5' 6.5" (1.689 m)   Wt 167 lb 9.6 oz (76 kg)   SpO2 98%   BMI 26.65 kg/m    Physical Exam Vitals reviewed.  Constitutional:      General: She is not in acute distress.    Appearance: She is well-developed. She is not diaphoretic.  HENT:     Head: Normocephalic and atraumatic.     Mouth/Throat:     Pharynx: No oropharyngeal exudate.  Eyes:     Pupils: Pupils are equal, round,  and reactive to light.  Neck:     Thyroid: No thyromegaly.     Vascular: No JVD.     Trachea: No tracheal deviation.  Cardiovascular:     Rate and Rhythm: Normal rate and regular rhythm.     Heart sounds: Normal heart sounds. No murmur heard.   No friction rub. No gallop.  Pulmonary:     Effort: Pulmonary effort is normal. No respiratory distress.     Breath sounds: No wheezing or rales.  Chest:     Chest wall: No tenderness.  Abdominal:     General: Bowel sounds are normal.     Palpations: Abdomen is soft.  Musculoskeletal:        General: Normal range of motion.     Cervical back: Normal range of motion and neck supple.  Lymphadenopathy:     Cervical: No cervical adenopathy.  Skin:    General: Skin is warm and dry.  Neurological:     Mental Status: She is alert and oriented to person, place, and time.     Cranial Nerves: No cranial nerve deficit.  Psychiatric:        Behavior: Behavior normal.        Thought Content: Thought content normal.        Judgment: Judgment normal.     Assessment/Plan: 1. Primary hypertension Consistently elevated blood pressures over many office visits, amlodipine 2.5 mg daily prescribed after discussing with patient.  - amLODipine (NORVASC) 2.5 MG tablet; Take 1 tablet (2.5 mg total) by mouth daily.   Dispense: 30 tablet; Refill: 0  2. Attention deficit disorder (ADD) in adult Dose changed to previous dose of 30 mg daily. Follow up in 3 months for UDS and refills.  - amphetamine-dextroamphetamine (ADDERALL XR) 30 MG 24 hr capsule; Take 1 capsule (30 mg total) by mouth every morning.  Dispense: 30 capsule; Refill: 0 - amphetamine-dextroamphetamine (ADDERALL XR) 30 MG 24 hr capsule; Take 1 capsule (30 mg total) by mouth every morning.  Dispense: 30 capsule; Refill: 0 - amphetamine-dextroamphetamine (ADDERALL XR) 30 MG 24 hr capsule; Take 1 capsule (30 mg total) by mouth every morning.  Dispense: 30 capsule; Refill: 0  3. Chronic midline low back pain with bilateral sciatica Meloxicam dose increased to 15 mg daily.  - meloxicam (MOBIC) 15 MG tablet; Take 1 tablet (15 mg total) by mouth daily.  Dispense: 30 tablet; Refill: 0  4. Encounter for long-term (current) use of high-risk medication UDS done in office today, results are consistent with adderall prescription  - POCT Urine Drug Screen   General Counseling: debroha salik understanding of the findings of todays visit and agrees with plan of treatment. I have discussed any further diagnostic evaluation that may be needed or ordered today. We also reviewed her medications today. she has been encouraged to call the office with any questions or concerns that should arise related to todays visit.    Orders Placed This Encounter  Procedures   POCT Urine Drug Screen    Meds ordered this encounter  Medications   amphetamine-dextroamphetamine (ADDERALL XR) 30 MG 24 hr capsule    Sig: Take 1 capsule (30 mg total) by mouth every morning.    Dispense:  30 capsule    Refill:  0    Fill for august   amphetamine-dextroamphetamine (ADDERALL XR) 30 MG 24 hr capsule    Sig: Take 1 capsule (30 mg total) by mouth every morning.    Dispense:  30 capsule  Refill:  0    Fill for september   amphetamine-dextroamphetamine (ADDERALL XR) 30 MG 24  hr capsule    Sig: Take 1 capsule (30 mg total) by mouth every morning.    Dispense:  30 capsule    Refill:  0    Fill for october   amLODipine (NORVASC) 2.5 MG tablet    Sig: Take 1 tablet (2.5 mg total) by mouth daily.    Dispense:  30 tablet    Refill:  0   meloxicam (MOBIC) 15 MG tablet    Sig: Take 1 tablet (15 mg total) by mouth daily.    Dispense:  30 tablet    Refill:  0    Return in about 4 weeks (around 07/24/2021) for F/U, BP check, [facility] PCP.   Total time spent:30 Minutes Time spent includes review of chart, medications, test results, and follow up plan with the patient.   [place] Controlled Substance Database was reviewed by me.  This patient was seen by Jonetta Osgood, FNP-C in collaboration with Dr. Clayborn Bigness as a part of collaborative care agreement.   Brienne Liguori R. Valetta Fuller, MSN, FNP-C Internal medicine

## 2021-07-17 ENCOUNTER — Other Ambulatory Visit: Payer: Self-pay

## 2021-07-17 ENCOUNTER — Encounter: Payer: Self-pay | Admitting: Nurse Practitioner

## 2021-07-17 ENCOUNTER — Ambulatory Visit: Payer: BC Managed Care – PPO | Admitting: Nurse Practitioner

## 2021-07-17 VITALS — BP 140/80 | HR 76 | Temp 98.0°F | Resp 16 | Ht 66.5 in | Wt 169.4 lb

## 2021-07-17 DIAGNOSIS — R5383 Other fatigue: Secondary | ICD-10-CM | POA: Diagnosis not present

## 2021-07-17 DIAGNOSIS — I10 Essential (primary) hypertension: Secondary | ICD-10-CM | POA: Diagnosis not present

## 2021-07-17 MED ORDER — AMLODIPINE BESYLATE 2.5 MG PO TABS: 2.5000 mg | ORAL_TABLET | Freq: Every day | ORAL | 2 refills | Status: DC

## 2021-07-17 NOTE — Progress Notes (Signed)
[facility] [place], [place] [address]  Internal MEDICINE  Office Visit Note  Patient Name: April Wilkins  [id number]  [id number]  Date of Service: 07/17/2021  Chief Complaint  Patient presents with   Follow-up   Hypertension   Quality Metric Gaps    Shingrix done pt will bring the records, pap due    HPI [name] presents for a follow up visit for hypertension. She was started on amlodipine at her previous office visit. She reports that she thinks the medication is causing her fatigue now but she wants to give it 1 more month.   Current Medication: Outpatient Encounter Medications as of 07/17/2021  Medication Sig   amphetamine-dextroamphetamine (ADDERALL XR) 30 MG 24 hr capsule Take 1 capsule (30 mg total) by mouth every morning.   amphetamine-dextroamphetamine (ADDERALL XR) 30 MG 24 hr capsule Take 1 capsule (30 mg total) by mouth every morning.   [START ON 08/21/2021] amphetamine-dextroamphetamine (ADDERALL XR) 30 MG 24 hr capsule Take 1 capsule (30 mg total) by mouth every morning.   meloxicam (MOBIC) 15 MG tablet Take 1 tablet (15 mg total) by mouth daily.   [DISCONTINUED] amLODipine (NORVASC) 2.5 MG tablet Take 1 tablet (2.5 mg total) by mouth daily.   amLODipine (NORVASC) 2.5 MG tablet Take 1 tablet (2.5 mg total) by mouth daily.   No facility-administered encounter medications on file as of 07/17/2021.    Surgical History: Past Surgical History:  Procedure Laterality Date   SKIN BIOPSY      Medical History: Past Medical History:  Diagnosis Date   Actinic keratosis 08/31/2010   Right forearm. Pigmented.   Allergy    Seasonal   Basal cell carcinoma (BCC) of right upper arm 02/08/2010   Right deltoid. Superficial.    Chicken pox    Dysplastic nevus 02/08/2010   R mid back bra line. Moderate to severe atypia, close to margin. Excised 03/28/2010, margins free.   Dysplastic nevus 02/08/2010   Left mid to low back above MM site. Moderate  atypia, close to margin   Dysplastic nevus 02/08/2010   Left sup. lat. buttocks near side. Moderate atypia, close to margin.   Dysplastic nevus 05/22/2015   Left lower lat. back. Moderat to severe atypia, close to margin. Excised 06/12/2015, margins free.   Dysplastic nevus 03/31/2018   Central upper abdomen. Moderate atypia, limited margins free.   Frequent headaches    Hypertension    Melanoma ([place]) 2010   left lower back   Migraines    Squamous cell carcinoma of skin    back   Squamous cell carcinoma of skin 12/20/2020   L upper arm, SCC IS, EDC 01/17/21    Family History: Family History  Problem Relation Age of Onset   Hyperlipidemia Father    Heart disease Father    Diabetes Father    Cancer Father        Skin Cancer   Alcohol abuse Paternal Uncle    Hyperlipidemia Paternal Uncle    Heart disease Paternal Uncle    Kidney disease Maternal Grandfather    Hyperlipidemia Paternal Grandmother    Heart disease Paternal Grandmother    Drug abuse Paternal Grandmother    Breast cancer Maternal Grandmother 70    Social History   Socioeconomic History   Marital status: Single    Spouse name: Not on file   Number of children: Not on file   Years of education: Not on file   Highest education level: Not  on file  Occupational History   Not on file  Tobacco Use   Smoking status: Never   Smokeless tobacco: Never  Substance and Sexual Activity   Alcohol use: Yes    Alcohol/week: 0.0 standard drinks    Comment: Rare    Drug use: No   Sexual activity: Yes    Partners: Male  Other Topics Concern   Not on file  Social History Narrative   Divorced   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     Social Determinants of Health   Financial Resource Strain: Not on file  Food Insecurity: Not on file  Transportation Needs: Not on file  Physical Activity: Not on file  Stress: Not on file   Social Connections: Not on file  Intimate Partner Violence: Not on file      Review of Systems  Constitutional:  Positive for fatigue. Negative for chills and unexpected weight change.  HENT:  Negative for congestion, rhinorrhea, sneezing and sore throat.   Eyes:  Negative for redness.  Respiratory:  Negative for cough, chest tightness and shortness of breath.   Cardiovascular:  Negative for chest pain and palpitations.  Gastrointestinal:  Negative for abdominal pain, constipation, diarrhea, nausea and vomiting.  Genitourinary:  Negative for dysuria and frequency.  Musculoskeletal:  Negative for arthralgias, back pain, joint swelling and neck pain.  Skin:  Negative for rash.  Neurological: Negative.  Negative for tremors and numbness.  Hematological:  Negative for adenopathy. Does not bruise/bleed easily.  Psychiatric/Behavioral:  Negative for behavioral problems (Depression), sleep disturbance and suicidal ideas. The patient is not nervous/anxious.    Vital Signs: BP 140/80 Comment: 145/89  Pulse 76   Temp 98 F (36.7 C)   Resp 16   Ht 5' 6.5" (1.689 m)   Wt 169 lb 6.4 oz (76.8 kg)   SpO2 98%   BMI 26.93 kg/m    Physical Exam Vitals reviewed.  Constitutional:      General: She is not in acute distress.    Appearance: Normal appearance. She is normal weight. She is not ill-appearing.  HENT:     Head: Normocephalic and atraumatic.  Eyes:     Extraocular Movements: Extraocular movements intact.     Pupils: Pupils are equal, round, and reactive to light.  Cardiovascular:     Rate and Rhythm: Normal rate and regular rhythm.  Pulmonary:     Effort: Pulmonary effort is normal. No respiratory distress.  Neurological:     Mental Status: She is alert and oriented to person, place, and time.     Cranial Nerves: No cranial nerve deficit.     Coordination: Coordination normal.     Gait: Gait normal.  Psychiatric:        Mood and Affect: Mood normal.        Behavior:  Behavior normal.     Assessment/Plan: 1. Primary hypertension Will reevaluate in 1-2 months and if patient is still having fatigue, will switch to a different BP medication - amLODipine (NORVASC) 2.5 MG tablet; Take 1 tablet (2.5 mg total) by mouth daily.  Dispense: 30 tablet; Refill: 2  2. Other fatigue Possibly being caused by amlodipine, may be coincidental.    General Counseling: Warden Fillers understanding of the findings of todays visit and agrees with plan of treatment. I have discussed any further diagnostic evaluation that may be  needed or ordered today. We also reviewed her medications today. she has been encouraged to call the office with any questions or concerns that should arise related to todays visit.    No orders of the defined types were placed in this encounter.   Meds ordered this encounter  Medications   amLODipine (NORVASC) 2.5 MG tablet    Sig: Take 1 tablet (2.5 mg total) by mouth daily.    Dispense:  30 tablet    Refill:  2    Return in about 2 months (around 09/16/2021) for F/U, ADHD med check, Nancy Manuele PCP will need UDS.   Total time spent:20 Minutes Time spent includes review of chart, medications, test results, and follow up plan with the patient.   [place] Controlled Substance Database was reviewed by me.  This patient was seen by Jonetta Osgood, FNP-C in collaboration with Dr. Clayborn Bigness as a part of collaborative care agreement.   Colbie Sliker R. Valetta Fuller, MSN, FNP-C Internal medicine

## 2021-07-24 ENCOUNTER — Ambulatory Visit: Payer: BC Managed Care – PPO | Admitting: Dermatology

## 2021-07-24 ENCOUNTER — Ambulatory Visit: Payer: BC Managed Care – PPO | Admitting: Nurse Practitioner

## 2021-07-30 ENCOUNTER — Encounter: Payer: Self-pay | Admitting: Nurse Practitioner

## 2021-08-03 ENCOUNTER — Encounter: Payer: Self-pay | Admitting: Nurse Practitioner

## 2021-08-07 ENCOUNTER — Other Ambulatory Visit: Payer: Self-pay | Admitting: Nurse Practitioner

## 2021-08-07 DIAGNOSIS — I10 Essential (primary) hypertension: Secondary | ICD-10-CM

## 2021-08-07 MED ORDER — LISINOPRIL 5 MG PO TABS: 5.0000 mg | ORAL_TABLET | Freq: Every day | ORAL | 0 refills | Status: DC

## 2021-08-08 ENCOUNTER — Other Ambulatory Visit: Payer: Self-pay | Admitting: Nurse Practitioner

## 2021-08-30 ENCOUNTER — Other Ambulatory Visit: Payer: Self-pay | Admitting: Nurse Practitioner

## 2021-08-30 ENCOUNTER — Encounter: Payer: Self-pay | Admitting: Nurse Practitioner

## 2021-08-30 DIAGNOSIS — I10 Essential (primary) hypertension: Secondary | ICD-10-CM

## 2021-08-30 MED ORDER — LISINOPRIL 5 MG PO TABS: 5.0000 mg | ORAL_TABLET | Freq: Every day | ORAL | 0 refills | Status: DC

## 2021-08-30 NOTE — Telephone Encounter (Signed)
Med sent to pharmacy.

## 2021-09-17 ENCOUNTER — Encounter: Payer: Self-pay | Admitting: Nurse Practitioner

## 2021-09-17 ENCOUNTER — Ambulatory Visit: Payer: BC Managed Care – PPO | Admitting: Internal Medicine

## 2021-09-17 ENCOUNTER — Other Ambulatory Visit: Payer: Self-pay

## 2021-09-17 VITALS — BP 152/94 | HR 67 | Temp 98.3°F | Resp 16 | Ht 66.5 in | Wt 170.2 lb

## 2021-09-17 DIAGNOSIS — F988 Other specified behavioral and emotional disorders with onset usually occurring in childhood and adolescence: Secondary | ICD-10-CM

## 2021-09-17 DIAGNOSIS — M159 Polyosteoarthritis, unspecified: Secondary | ICD-10-CM | POA: Diagnosis not present

## 2021-09-17 DIAGNOSIS — Z0001 Encounter for general adult medical examination with abnormal findings: Secondary | ICD-10-CM

## 2021-09-17 DIAGNOSIS — I10 Essential (primary) hypertension: Secondary | ICD-10-CM | POA: Diagnosis not present

## 2021-09-17 DIAGNOSIS — R5383 Other fatigue: Secondary | ICD-10-CM | POA: Diagnosis not present

## 2021-09-17 DIAGNOSIS — Z1231 Encounter for screening mammogram for malignant neoplasm of breast: Secondary | ICD-10-CM

## 2021-09-17 DIAGNOSIS — Z1239 Encounter for other screening for malignant neoplasm of breast: Secondary | ICD-10-CM

## 2021-09-17 NOTE — Progress Notes (Signed)
[facility] [place], [place] [address]  Internal MEDICINE  Office Visit Note  Patient Name: April Wilkins  [id number]  [id number]  Date of Service: 09/20/2021  Chief Complaint  Patient presents with   Follow-up    refills   Hypertension    HPI Pt is here for routine follow up - needs refills on her Adderall, takes for attention and focus  - Bp is not well controlled at all, was just started on Zestril 5 mg qd  - c/o of fatigue, has difficulty sleeping at night  - denies any chest pain or sob    Current Medication: Outpatient Encounter Medications as of 09/17/2021  Medication Sig   amphetamine-dextroamphetamine (ADDERALL XR) 30 MG 24 hr capsule Take 1 capsule (30 mg total) by mouth every morning.   lisinopril (ZESTRIL) 5 MG tablet Take 1 tablet (5 mg total) by mouth daily.   meloxicam (MOBIC) 15 MG tablet Take 1 tablet (15 mg total) by mouth daily.   [DISCONTINUED] amphetamine-dextroamphetamine (ADDERALL XR) 30 MG 24 hr capsule Take 1 capsule (30 mg total) by mouth every morning. (Patient not taking: Reported on 09/17/2021)   [DISCONTINUED] amphetamine-dextroamphetamine (ADDERALL XR) 30 MG 24 hr capsule Take 1 capsule (30 mg total) by mouth every morning. (Patient not taking: Reported on 09/17/2021)   No facility-administered encounter medications on file as of 09/17/2021.    Surgical History: Past Surgical History:  Procedure Laterality Date   SKIN BIOPSY      Medical History: Past Medical History:  Diagnosis Date   Actinic keratosis 08/31/2010   Right forearm. Pigmented.   Allergy    Seasonal   Basal cell carcinoma (BCC) of right upper arm 02/08/2010   Right deltoid. Superficial.    Chicken pox    Dysplastic nevus 02/08/2010   R mid back bra line. Moderate to severe atypia, close to margin. Excised 03/28/2010, margins free.   Dysplastic nevus 02/08/2010   Left mid to low back above MM site. Moderate atypia, close to margin    Dysplastic nevus 02/08/2010   Left sup. lat. buttocks near side. Moderate atypia, close to margin.   Dysplastic nevus 05/22/2015   Left lower lat. back. Moderat to severe atypia, close to margin. Excised 06/12/2015, margins free.   Dysplastic nevus 03/31/2018   Central upper abdomen. Moderate atypia, limited margins free.   Frequent headaches    Hypertension    Melanoma ([place]) 2010   left lower back   Migraines    Squamous cell carcinoma of skin    back   Squamous cell carcinoma of skin 12/20/2020   L upper arm, SCC IS, EDC 01/17/21    Family History: Family History  Problem Relation Age of Onset   Hyperlipidemia Father    Heart disease Father    Diabetes Father    Cancer Father        Skin Cancer   Alcohol abuse Paternal Uncle    Hyperlipidemia Paternal Uncle    Heart disease Paternal Uncle    Kidney disease Maternal Grandfather    Hyperlipidemia Paternal Grandmother    Heart disease Paternal Grandmother    Drug abuse Paternal Grandmother    Breast cancer Maternal Grandmother 70    Social History   Socioeconomic History   Marital status: Single    Spouse name: Not on file   Number of children: Not on file   Years of education: Not on file   Highest education level: Not on file  Occupational History  Not on file  Tobacco Use   Smoking status: Never   Smokeless tobacco: Never  Substance and Sexual Activity   Alcohol use: Yes    Alcohol/week: 0.0 standard drinks    Comment: Rare    Drug use: No   Sexual activity: Yes    Partners: Male  Other Topics Concern   Not on file  Social History Narrative   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     Social Determinants of Health   Financial Resource Strain: Not on file  Food Insecurity: Not on file  Transportation Needs: Not on file  Physical Activity: Not on file  Stress: Not on file  Social Connections: Not on file   Intimate Partner Violence: Not on file      Review of Systems  Constitutional:  Positive for fatigue. Negative for fever.  HENT:  Negative for congestion, mouth sores and postnasal drip.   Respiratory: Negative.  Negative for cough.   Cardiovascular:  Negative for chest pain.       Elevated BP   Gastrointestinal: Negative.   Genitourinary:  Negative for flank pain.  Musculoskeletal:  Positive for arthralgias.  Psychiatric/Behavioral:  Positive for dysphoric mood.    Vital Signs: BP (!) 152/94   Pulse 67   Temp 98.3 F (36.8 C)   Resp 16   Ht 5' 6.5" (1.689 m)   Wt 170 lb 3.2 oz (77.2 kg)   SpO2 99%   BMI 27.06 kg/m    Physical Exam Constitutional:      Appearance: Normal appearance.  HENT:     Head: Normocephalic and atraumatic.     Nose: Nose normal.     Mouth/Throat:     Mouth: Mucous membranes are moist.     Pharynx: No posterior oropharyngeal erythema.  Eyes:     Extraocular Movements: Extraocular movements intact.     Pupils: Pupils are equal, round, and reactive to light.  Cardiovascular:     Pulses: Normal pulses.     Heart sounds: Normal heart sounds.  Pulmonary:     Effort: Pulmonary effort is normal.     Breath sounds: Normal breath sounds.  Neurological:     General: No focal deficit present.     Mental Status: She is alert.  Psychiatric:        Mood and Affect: Mood normal.        Behavior: Behavior normal.       Assessment/Plan: 1. Primary hypertension Increase Zestril to 10 mg qd x one week and then to 15 mg qd. Pt has a RX for 5 mg, will be seen for follow up for further Titration  - Comprehensive metabolic panel   2. Other fatigue Will need to look into Fatigue caused by OSA - CBC with Differential/Platelet - Lipid Panel With LDL/HDL Ratio - TSH - T4, free- B12 and Folate Panel - Fe+TIBC+Fer  3. Attention deficit disorder (ADD) in adult Will not refill her Adderall due to uncontrolled HTN   4. Generalized  osteoarthritis Continue NSAIDS   5. Breast screening - MM 3D SCREEN BREAST BILATERAL; Future   General Counseling: georgie eduardo understanding of the findings of todays visit and agrees with plan of treatment. I have discussed any further diagnostic evaluation that may be needed or ordered today. We also reviewed her medications today. she has been encouraged  to call the office with any questions or concerns that should arise related to todays visit.    Orders Placed This Encounter  Procedures   MM 3D SCREEN BREAST BILATERAL   CBC with Differential/Platelet   Lipid Panel With LDL/HDL Ratio   TSH   T4, free   Comprehensive metabolic panel   J79 and Folate Panel   Fe+TIBC+Fer     No orders of the defined types were placed in this encounter.   Total time spent:35 Minutes Time spent includes review of chart, medications, test results, and follow up plan with the patient.   [place] Controlled Substance Database was reviewed by me.   Dr Lavera Guise Internal medicine

## 2021-09-20 ENCOUNTER — Other Ambulatory Visit: Payer: Self-pay | Admitting: Nurse Practitioner

## 2021-09-20 DIAGNOSIS — I10 Essential (primary) hypertension: Secondary | ICD-10-CM

## 2021-09-20 MED ORDER — LISINOPRIL 10 MG PO TABS: 10.0000 mg | ORAL_TABLET | Freq: Two times a day (BID) | ORAL | 2 refills | Status: DC

## 2021-09-20 NOTE — Telephone Encounter (Signed)
Please call patient and let her know that I have increased her lisinopril to the 10 mg tablet twice daily since she was taking 5 mg 3 times daily and still needed a little more control on her BP

## 2021-09-27 LAB — COMPREHENSIVE METABOLIC PANEL
ALT: 18 IU/L (ref 0–32)
AST: 18 IU/L (ref 0–40)
Albumin/Globulin Ratio: 1.6 (ref 1.2–2.2)
Albumin: 4.4 g/dL (ref 3.8–4.8)
Alkaline Phosphatase: 120 IU/L (ref 44–121)
BUN/Creatinine Ratio: 16 (ref 12–28)
BUN: 16 mg/dL (ref 8–27)
Bilirubin Total: 0.4 mg/dL (ref 0.0–1.2)
CO2: 26 mmol/L (ref 20–29)
Calcium: 9.6 mg/dL (ref 8.7–10.3)
Chloride: 102 mmol/L (ref 96–106)
Creatinine, Ser: 0.97 mg/dL (ref 0.57–1.00)
Globulin, Total: 2.7 g/dL (ref 1.5–4.5)
Glucose: 96 mg/dL (ref 70–99)
Potassium: 4.6 mmol/L (ref 3.5–5.2)
Sodium: 141 mmol/L (ref 134–144)
Total Protein: 7.1 g/dL (ref 6.0–8.5)
eGFR: 66 mL/min/{1.73_m2} (ref >59)

## 2021-09-27 LAB — CBC WITH DIFFERENTIAL/PLATELET
Basophils Absolute: 0.1 10*3/uL (ref 0.0–0.2)
Basos: 1 %
EOS (ABSOLUTE): 0.2 10*3/uL (ref 0.0–0.4)
Eos: 2 %
Hematocrit: 45.8 % (ref 34.0–46.6)
Hemoglobin: 15.2 g/dL (ref 11.1–15.9)
Immature Grans (Abs): 0 10*3/uL (ref 0.0–0.1)
Immature Granulocytes: 0 %
Lymphocytes Absolute: 2.7 10*3/uL (ref 0.7–3.1)
Lymphs: 35 %
MCH: 29.5 pg (ref 26.6–33.0)
MCHC: 33.2 g/dL (ref 31.5–35.7)
MCV: 89 fL (ref 79–97)
Monocytes Absolute: 0.4 10*3/uL (ref 0.1–0.9)
Monocytes: 5 %
Neutrophils Absolute: 4.4 10*3/uL (ref 1.4–7.0)
Neutrophils: 57 %
Platelets: 388 10*3/uL (ref 150–450)
RBC: 5.16 x10E6/uL (ref 3.77–5.28)
RDW: 13 % (ref 11.7–15.4)
WBC: 7.7 10*3/uL (ref 3.4–10.8)

## 2021-09-27 LAB — LIPID PANEL WITH LDL/HDL RATIO
Cholesterol, Total: 217 mg/dL — ABNORMAL HIGH (ref 100–199)
HDL: 53 mg/dL (ref >39)
LDL Chol Calc (NIH): 143 mg/dL — ABNORMAL HIGH (ref 0–99)
LDL/HDL Ratio: 2.7 ratio (ref 0.0–3.2)
Triglycerides: 116 mg/dL (ref 0–149)
VLDL Cholesterol Cal: 21 mg/dL (ref 5–40)

## 2021-09-27 LAB — IRON,TIBC AND FERRITIN PANEL
Ferritin: 333 ng/mL — ABNORMAL HIGH (ref 15–150)
Iron Saturation: 45 % (ref 15–55)
Iron: 121 ug/dL (ref 27–139)
Total Iron Binding Capacity: 271 ug/dL (ref 250–450)
UIBC: 150 ug/dL (ref 118–369)

## 2021-09-27 LAB — B12 AND FOLATE PANEL
Folate: 13.8 ng/mL (ref >3.0)
Vitamin B-12: 408 pg/mL (ref 232–1245)

## 2021-09-27 LAB — TSH: TSH: 2.32 u[IU]/mL (ref 0.450–4.500)

## 2021-09-27 LAB — T4, FREE: Free T4: 1.26 ng/dL (ref 0.82–1.77)

## 2021-10-08 ENCOUNTER — Telehealth: Payer: Self-pay

## 2021-10-08 NOTE — Telephone Encounter (Signed)
-----   Message from Lavera Guise, MD sent at 10/08/2021  1:39 PM EST ----- Abnormal lipid profile, will be discussed on next follow up

## 2021-10-08 NOTE — Telephone Encounter (Signed)
Spoke to pt and informed her that her lipid profile was abnormal and will discuss at next office visit

## 2021-10-11 ENCOUNTER — Ambulatory Visit: Payer: BC Managed Care – PPO

## 2021-10-15 ENCOUNTER — Encounter: Payer: BC Managed Care – PPO | Admitting: Nurse Practitioner

## 2021-10-22 ENCOUNTER — Encounter: Payer: Self-pay | Admitting: Nurse Practitioner

## 2021-10-22 ENCOUNTER — Other Ambulatory Visit: Payer: Self-pay

## 2021-10-22 ENCOUNTER — Ambulatory Visit (INDEPENDENT_AMBULATORY_CARE_PROVIDER_SITE_OTHER): Payer: BC Managed Care – PPO | Admitting: Nurse Practitioner

## 2021-10-22 VITALS — BP 152/81 | HR 71 | Temp 98.1°F | Resp 16 | Ht 67.0 in | Wt 174.4 lb

## 2021-10-22 DIAGNOSIS — F988 Other specified behavioral and emotional disorders with onset usually occurring in childhood and adolescence: Secondary | ICD-10-CM | POA: Diagnosis not present

## 2021-10-22 DIAGNOSIS — M159 Polyosteoarthritis, unspecified: Secondary | ICD-10-CM

## 2021-10-22 DIAGNOSIS — R300 Dysuria: Secondary | ICD-10-CM

## 2021-10-22 DIAGNOSIS — I10 Essential (primary) hypertension: Secondary | ICD-10-CM

## 2021-10-22 DIAGNOSIS — Z0001 Encounter for general adult medical examination with abnormal findings: Secondary | ICD-10-CM

## 2021-10-22 MED ORDER — LISINOPRIL 30 MG PO TABS: 30.0000 mg | ORAL_TABLET | Freq: Every day | ORAL | 2 refills | Status: DC

## 2021-10-22 NOTE — Progress Notes (Signed)
[facility] [place], [place] [address]  Internal MEDICINE  Office Visit Note  Patient Name: April Wilkins  [id number]  [id number]  Date of Service: 10/22/2021  Chief Complaint  Patient presents with   Hypertension   Annual Exam    Discuss meds    HPI April Wilkins presents for an annual well visit and physical exam.  She is a well-appearing 60 year old female. She has hypertension and her blood pressures not under optimal control yet.  She had the Cologuard stool test done in 2021 so it will be due in 2024.  She has her mammogram scheduled in early January for a routine screening. For hypertension, she is currently on lisinopril 10 mg twice daily.  She was initially started on amlodipine and August continued it through September but had difficulty tolerating the side effects.  She was switched to lisinopril which was then increased in November but her blood pressure is still elevated today.  At her previous office visit, she was advised to hold off on taking her Adderall until her blood pressure can be better controlled.  She is also on meloxicam for pain which can increase blood pressure as well.  She is open to medication adjustment at today's visit. Lab results discussed with patient.  Her thyroid levels are within normal limits.  Her metabolic panel is also within normal limits and her B12 and folate are normal as well.  Her lipid panel is abnormal with her total cholesterol elevated at 217 and her LDL elevated at 143.  Her triglycerides are normal at 116, HDL is 53 and VLDL is 21.  Her LDL/HDL ratio is 2.7 which is below average risk for women. Her ferritin level is elevated at 333 but is not elevated enough to be concerned for hemochromatosis or iron overload and her iron level is normal.  Her CBC shows no anemia    Current Medication: Outpatient Encounter Medications as of 10/22/2021  Medication Sig   lisinopril (ZESTRIL) 30 MG tablet Take 1 tablet (30 mg  total) by mouth daily.   meloxicam (MOBIC) 15 MG tablet Take 1 tablet (15 mg total) by mouth daily.   [DISCONTINUED] lisinopril (ZESTRIL) 10 MG tablet Take 1 tablet (10 mg total) by mouth in the morning and at bedtime.   amphetamine-dextroamphetamine (ADDERALL XR) 30 MG 24 hr capsule Take 1 capsule (30 mg total) by mouth every morning. (Patient not taking: Reported on 10/22/2021)   No facility-administered encounter medications on file as of 10/22/2021.    Surgical History: Past Surgical History:  Procedure Laterality Date   SKIN BIOPSY      Medical History: Past Medical History:  Diagnosis Date   Actinic keratosis 08/31/2010   Right forearm. Pigmented.   Allergy    Seasonal   Basal cell carcinoma (BCC) of right upper arm 02/08/2010   Right deltoid. Superficial.    Chicken pox    Dysplastic nevus 02/08/2010   R mid back bra line. Moderate to severe atypia, close to margin. Excised 03/28/2010, margins free.   Dysplastic nevus 02/08/2010   Left mid to low back above MM site. Moderate atypia, close to margin   Dysplastic nevus 02/08/2010   Left sup. lat. buttocks near side. Moderate atypia, close to margin.   Dysplastic nevus 05/22/2015   Left lower lat. back. Moderat to severe atypia, close to margin. Excised 06/12/2015, margins free.   Dysplastic nevus 03/31/2018   Central upper abdomen. Moderate atypia, limited margins free.   Frequent headaches  Hypertension    Melanoma ([place]) 2010   left lower back   Migraines    Squamous cell carcinoma of skin    back   Squamous cell carcinoma of skin 12/20/2020   L upper arm, SCC IS, EDC 01/17/21    Family History: Family History  Problem Relation Age of Onset   Hyperlipidemia Father    Heart disease Father    Diabetes Father    Cancer Father        Skin Cancer   Alcohol abuse Paternal Uncle    Hyperlipidemia Paternal Uncle    Heart disease Paternal Uncle    Kidney disease Maternal Grandfather    Hyperlipidemia Paternal  Grandmother    Heart disease Paternal Grandmother    Drug abuse Paternal Grandmother    Breast cancer Maternal Grandmother 70    Social History   Socioeconomic History   Marital status: Single    Spouse name: Not on file   Number of children: Not on file   Years of education: Not on file   Highest education level: Not on file  Occupational History   Not on file  Tobacco Use   Smoking status: Never   Smokeless tobacco: Never  Substance and Sexual Activity   Alcohol use: Yes    Alcohol/week: 0.0 standard drinks    Comment: Rare    Drug use: No   Sexual activity: Yes    Partners: Male  Other Topics Concern   Not on file  Social History Narrative   Divorced   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     Social Determinants of Health   Financial Resource Strain: Not on file  Food Insecurity: Not on file  Transportation Needs: Not on file  Physical Activity: Not on file  Stress: Not on file  Social Connections: Not on file  Intimate Partner Violence: Not on file      Review of Systems  Constitutional:  Negative for activity change, appetite change, chills, fatigue, fever and unexpected weight change.  HENT: Negative.  Negative for congestion, ear pain, rhinorrhea, sore throat and trouble swallowing.   Eyes: Negative.   Respiratory: Negative.  Negative for cough, chest tightness, shortness of breath and wheezing.   Cardiovascular: Negative.  Negative for chest pain.  Gastrointestinal: Negative.  Negative for abdominal pain, blood in stool, constipation, diarrhea, nausea and vomiting.  Endocrine: Negative.   Genitourinary: Negative.  Negative for difficulty urinating, dysuria, frequency, hematuria and urgency.  Musculoskeletal: Negative.  Negative for arthralgias, back pain, joint swelling, myalgias and neck pain.  Skin: Negative.  Negative for rash and wound.  Allergic/Immunologic:  Negative.  Negative for immunocompromised state.  Neurological: Negative.  Negative for dizziness, seizures, numbness and headaches.  Hematological: Negative.   Psychiatric/Behavioral: Negative.  Negative for behavioral problems, self-injury and suicidal ideas. The patient is not nervous/anxious.    Vital Signs: BP (!) 152/81 Comment: 150/85  Pulse 71   Temp 98.1 F (36.7 C)   Resp 16   Ht 5\' 7"  (1.702 m)   Wt 174 lb 6.4 oz (79.1 kg)   SpO2 97%   BMI 27.31 kg/m    Physical Exam Vitals reviewed.  Constitutional:      General: She is awake. She is not in acute distress.    Appearance: Normal appearance. She is well-developed, well-groomed and overweight. She is not ill-appearing or  diaphoretic.  HENT:     Head: Normocephalic and atraumatic.     Right Ear: Tympanic membrane, ear canal and external ear normal.     Left Ear: Tympanic membrane, ear canal and external ear normal.     Nose: Nose normal. No congestion or rhinorrhea.     Mouth/Throat:     Lips: Pink.     Mouth: Mucous membranes are moist.     Pharynx: Oropharynx is clear. Uvula midline. No oropharyngeal exudate or posterior oropharyngeal erythema.  Eyes:     General: Lids are normal. Vision grossly intact. Gaze aligned appropriately. No scleral icterus.       Right eye: No discharge.        Left eye: No discharge.     Conjunctiva/sclera: Conjunctivae normal.     Pupils: Pupils are equal, round, and reactive to light.     Funduscopic exam:    Right eye: Red reflex present.        Left eye: Red reflex present. Neck:     Thyroid: No thyromegaly.     Vascular: No JVD.     Trachea: Trachea and phonation normal. No tracheal deviation.  Cardiovascular:     Rate and Rhythm: Normal rate and regular rhythm.     Pulses: Normal pulses.     Heart sounds: Normal heart sounds, S1 normal and S2 normal. No murmur heard.   No friction rub. No gallop.  Pulmonary:     Effort: Pulmonary effort is normal. No accessory muscle usage  or respiratory distress.     Breath sounds: Normal breath sounds and air entry. No stridor. No wheezing or rales.  Chest:     Chest wall: No tenderness.     Comments: Declined clinical breast exam, gets annual mammograms Abdominal:     General: Bowel sounds are normal. There is no distension.     Palpations: Abdomen is soft. There is no shifting dullness, fluid wave, mass or pulsatile mass.     Tenderness: There is no abdominal tenderness. There is no guarding or rebound.  Musculoskeletal:        General: No tenderness or deformity. Normal range of motion.     Cervical back: Normal range of motion and neck supple.     Right lower leg: No edema.     Left lower leg: No edema.  Lymphadenopathy:     Cervical: No cervical adenopathy.  Skin:    General: Skin is warm and dry.     Capillary Refill: Capillary refill takes less than 2 seconds.     Coloration: Skin is not pale.     Findings: No erythema or rash.  Neurological:     Mental Status: She is alert and oriented to person, place, and time.     Cranial Nerves: No cranial nerve deficit.     Motor: No abnormal muscle tone.     Coordination: Coordination normal.     Gait: Gait normal.     Deep Tendon Reflexes: Reflexes are normal and symmetric.  Psychiatric:        Mood and Affect: Mood normal.        Behavior: Behavior normal. Behavior is cooperative.        Thought Content: Thought content normal.        Judgment: Judgment normal.       Assessment/Plan: 1. Encounter for general adult medical examination with abnormal findings Age-appropriate preventive screenings and vaccinations discussed, annual physical exam completed. Routine labs for health maintenance done in  late November, results discussed at today's office visit. PHM updated.   2. Primary hypertension Blood pressure remains poorly controlled, lisinopril dose increased to 40 mg daily.  Follow-up in 1 month - lisinopril (ZESTRIL) 30 MG tablet; Take 1 tablet (30 mg  total) by mouth daily.  Dispense: 30 tablet; Refill: 2  3. Generalized osteoarthritis Manageable with current over-the-counter and prescription medications.  4. Dysuria Routine urinalysis done - UA/M w/rflx Culture, Routine - Microscopic Examination - Urine Culture, Reflex  5. Attention deficit disorder (ADD) in adult Not currently taking medications at this time      General Counseling: tris howell understanding of the findings of todays visit and agrees with plan of treatment. I have discussed any further diagnostic evaluation that may be needed or ordered today. We also reviewed her medications today. she has been encouraged to call the office with any questions or concerns that should arise related to todays visit.    Orders Placed This Encounter  Procedures   Microscopic Examination   Urine Culture, Reflex   UA/M w/rflx Culture, Routine    Meds ordered this encounter  Medications   lisinopril (ZESTRIL) 30 MG tablet    Sig: Take 1 tablet (30 mg total) by mouth daily.    Dispense:  30 tablet    Refill:  2    Please note dose change, discontinue 10 mg lisinopril tablet.    Return in about 4 weeks (around 11/19/2021) for F/U, BP check, Darlene Brozowski PCP.   Total time spent:30 Minutes Time spent includes review of chart, medications, test results, and follow up plan with the patient.   [place] Controlled Substance Database was reviewed by me.  This patient was seen by Jonetta Osgood, FNP-C in collaboration with Dr. Clayborn Bigness as a part of collaborative care agreement.  Geni Skorupski R. Valetta Fuller, MSN, FNP-C Internal medicine

## 2021-10-23 ENCOUNTER — Encounter: Payer: Self-pay | Admitting: Nurse Practitioner

## 2021-10-24 ENCOUNTER — Telehealth: Payer: Self-pay

## 2021-10-26 ENCOUNTER — Other Ambulatory Visit: Payer: Self-pay

## 2021-10-26 MED ORDER — NITROFURANTOIN MONOHYD MACRO 100 MG PO CAPS: 100.0000 mg | ORAL_CAPSULE | Freq: Two times a day (BID) | ORAL | 0 refills | Status: DC

## 2021-10-26 NOTE — Telephone Encounter (Signed)
Pt notified that she has a UTI and that we sent Macrobid to her pharmacy.

## 2021-10-27 ENCOUNTER — Telehealth: Payer: Self-pay

## 2021-10-27 NOTE — Telephone Encounter (Signed)
Pt informed of having a UTI and that [place] sent in nitrofurantoin to her pharmacy on 10/26/21

## 2021-10-27 NOTE — Telephone Encounter (Signed)
error 

## 2021-11-01 LAB — UA/M W/RFLX CULTURE, ROUTINE
Bilirubin, UA: NEGATIVE
Glucose, UA: NEGATIVE
Ketones, UA: NEGATIVE
Nitrite, UA: POSITIVE — AB
Protein,UA: NEGATIVE
Specific Gravity, UA: 1.019 (ref 1.005–1.030)
Urobilinogen, Ur: 0.2 mg/dL (ref 0.2–1.0)
pH, UA: 5 (ref 5.0–7.5)

## 2021-11-01 LAB — MICROSCOPIC EXAMINATION
Casts: NONE SEEN /lpf
RBC, Urine: NONE SEEN /hpf (ref 0–2)
WBC, UA: >30 /hpf — AB (ref 0–5)

## 2021-11-01 LAB — URINE CULTURE, REFLEX

## 2021-11-10 NOTE — Progress Notes (Signed)
[facility] [place], [place] [address]  Internal MEDICINE  Office Visit Note  Patient Name: April Wilkins  [id number]  [id number]  Date of Service: 10/22/2021  Chief Complaint  Patient presents with   Hypertension   Annual Exam    Discuss meds    HPI April Wilkins presents for an annual well visit and physical exam.  She is a well-appearing 60 year old female. She has hypertension and her blood pressures not under optimal control yet.  She had the Cologuard stool test done in 2021 so it will be due in 2024.  She has her mammogram scheduled in early January for a routine screening. For hypertension, she is currently on lisinopril 10 mg twice daily.  She was initially started on amlodipine and August continued it through September but had difficulty tolerating the side effects.  She was switched to lisinopril which was then increased in November but her blood pressure is still elevated today.  At her previous office visit, she was advised to hold off on taking her Adderall until her blood pressure can be better controlled.  She is also on meloxicam for pain which can increase blood pressure as well.  She is open to medication adjustment at today's visit. Lab results discussed with patient.  Her thyroid levels are within normal limits.  Her metabolic panel is also within normal limits and her B12 and folate are normal as well.  Her lipid panel is abnormal with her total cholesterol elevated at 217 and her LDL elevated at 143.  Her triglycerides are normal at 116, HDL is 53 and VLDL is 21.  Her LDL/HDL ratio is 2.7 which is below average risk for women. Her ferritin level is elevated at 333 but is not elevated enough to be concerned for hemochromatosis or iron overload and her iron level is normal.  Her CBC shows no anemia    Current Medication: Outpatient Encounter Medications as of 10/22/2021  Medication Sig   lisinopril (ZESTRIL) 30 MG tablet Take 1 tablet (30 mg  total) by mouth daily.   meloxicam (MOBIC) 15 MG tablet Take 1 tablet (15 mg total) by mouth daily.   [DISCONTINUED] lisinopril (ZESTRIL) 10 MG tablet Take 1 tablet (10 mg total) by mouth in the morning and at bedtime.   amphetamine-dextroamphetamine (ADDERALL XR) 30 MG 24 hr capsule Take 1 capsule (30 mg total) by mouth every morning. (Patient not taking: Reported on 10/22/2021)   No facility-administered encounter medications on file as of 10/22/2021.    Surgical History: Past Surgical History:  Procedure Laterality Date   SKIN BIOPSY      Medical History: Past Medical History:  Diagnosis Date   Actinic keratosis 08/31/2010   Right forearm. Pigmented.   Allergy    Seasonal   Basal cell carcinoma (BCC) of right upper arm 02/08/2010   Right deltoid. Superficial.    Chicken pox    Dysplastic nevus 02/08/2010   R mid back bra line. Moderate to severe atypia, close to margin. Excised 03/28/2010, margins free.   Dysplastic nevus 02/08/2010   Left mid to low back above MM site. Moderate atypia, close to margin   Dysplastic nevus 02/08/2010   Left sup. lat. buttocks near side. Moderate atypia, close to margin.   Dysplastic nevus 05/22/2015   Left lower lat. back. Moderat to severe atypia, close to margin. Excised 06/12/2015, margins free.   Dysplastic nevus 03/31/2018   Central upper abdomen. Moderate atypia, limited margins free.   Frequent headaches  Hypertension    Melanoma ([address]) 2010   left lower back   Migraines    Squamous cell carcinoma of skin    back   Squamous cell carcinoma of skin 12/20/2020   L upper arm, SCC IS, EDC 01/17/21    Family History: Family History  Problem Relation Age of Onset   Hyperlipidemia Father    Heart disease Father    Diabetes Father    Cancer Father        Skin Cancer   Alcohol abuse Paternal Uncle    Hyperlipidemia Paternal Uncle    Heart disease Paternal Uncle    Kidney disease Maternal Grandfather    Hyperlipidemia Paternal  Grandmother    Heart disease Paternal Grandmother    Drug abuse Paternal Grandmother    Breast cancer Maternal Grandmother 70    Social History   Socioeconomic History   Marital status: Single    Spouse name: Not on file   Number of children: Not on file   Years of education: Not on file   Highest education level: Not on file  Occupational History   Not on file  Tobacco Use   Smoking status: Never   Smokeless tobacco: Never  Substance and Sexual Activity   Alcohol use: Yes    Alcohol/week: 0.0 standard drinks    Comment: Rare    Drug use: No   Sexual activity: Yes    Partners: Male  Other Topics Concern   Not on file  Social History Narrative   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     Social Determinants of Health   Financial Resource Strain: Not on file  Food Insecurity: Not on file  Transportation Needs: Not on file  Physical Activity: Not on file  Stress: Not on file  Social Connections: Not on file  Intimate Partner Violence: Not on file      Review of Systems  Constitutional:  Negative for activity change, appetite change, chills, fatigue, fever and unexpected weight change.  HENT: Negative.  Negative for congestion, ear pain, rhinorrhea, sore throat and trouble swallowing.   Eyes: Negative.   Respiratory: Negative.  Negative for cough, chest tightness, shortness of breath and wheezing.   Cardiovascular: Negative.  Negative for chest pain.  Gastrointestinal: Negative.  Negative for abdominal pain, blood in stool, constipation, diarrhea, nausea and vomiting.  Endocrine: Negative.   Genitourinary: Negative.  Negative for difficulty urinating, dysuria, frequency, hematuria and urgency.  Musculoskeletal: Negative.  Negative for arthralgias, back pain, joint swelling, myalgias and neck pain.  Skin: Negative.  Negative for rash and wound.  Allergic/Immunologic:  Negative.  Negative for immunocompromised state.  Neurological: Negative.  Negative for dizziness, seizures, numbness and headaches.  Hematological: Negative.   Psychiatric/Behavioral: Negative.  Negative for behavioral problems, self-injury and suicidal ideas. The patient is not nervous/anxious.    Vital Signs: BP (!) 152/81 Comment: 150/85   Pulse 71    Temp 98.1 F (36.7 C)    Resp 16    Ht 5\' 7"  (1.702 m)    Wt 174 lb 6.4 oz (79.1 kg)    SpO2 97%    BMI 27.31 kg/m    Physical Exam Vitals reviewed.  Constitutional:      General: She is awake. She is not in acute distress.    Appearance: Normal appearance. She is well-developed, well-groomed  and overweight. She is not ill-appearing or diaphoretic.  HENT:     Head: Normocephalic and atraumatic.     Right Ear: Tympanic membrane, ear canal and external ear normal.     Left Ear: Tympanic membrane, ear canal and external ear normal.     Nose: Nose normal. No congestion or rhinorrhea.     Mouth/Throat:     Lips: Pink.     Mouth: Mucous membranes are moist.     Pharynx: Oropharynx is clear. Uvula midline. No oropharyngeal exudate or posterior oropharyngeal erythema.  Eyes:     General: Lids are normal. Vision grossly intact. Gaze aligned appropriately. No scleral icterus.       Right eye: No discharge.        Left eye: No discharge.     Extraocular Movements: Extraocular movements intact.     Conjunctiva/sclera: Conjunctivae normal.     Pupils: Pupils are equal, round, and reactive to light.     Funduscopic exam:    Right eye: Red reflex present.        Left eye: Red reflex present. Neck:     Thyroid: No thyromegaly.     Vascular: No JVD.     Trachea: Trachea and phonation normal. No tracheal deviation.  Cardiovascular:     Rate and Rhythm: Normal rate and regular rhythm.     Pulses: Normal pulses.     Heart sounds: Normal heart sounds, S1 normal and S2 normal. No murmur heard.   No friction rub. No gallop.  Pulmonary:      Effort: Pulmonary effort is normal. No accessory muscle usage or respiratory distress.     Breath sounds: Normal breath sounds and air entry. No stridor. No wheezing or rales.  Chest:     Chest wall: No tenderness.     Comments: Declined clinical breast exam, gets annual mammograms. Abdominal:     General: Bowel sounds are normal. There is no distension.     Palpations: Abdomen is soft. There is no shifting dullness, fluid wave, mass or pulsatile mass.     Tenderness: There is no abdominal tenderness. There is no guarding or rebound.  Musculoskeletal:        General: No tenderness or deformity. Normal range of motion.     Cervical back: Normal range of motion and neck supple.     Right lower leg: No edema.     Left lower leg: No edema.  Lymphadenopathy:     Cervical: No cervical adenopathy.  Skin:    General: Skin is warm and dry.     Capillary Refill: Capillary refill takes less than 2 seconds.     Coloration: Skin is not pale.     Findings: No erythema or rash.  Neurological:     Mental Status: She is alert and oriented to person, place, and time.     Cranial Nerves: No cranial nerve deficit.     Motor: No abnormal muscle tone.     Coordination: Coordination normal.     Gait: Gait normal.     Deep Tendon Reflexes: Reflexes are normal and symmetric.  Psychiatric:        Mood and Affect: Mood normal.        Behavior: Behavior normal. Behavior is cooperative.        Thought Content: Thought content normal.        Judgment: Judgment normal.       Assessment/Plan: 1. Encounter for general adult medical examination with abnormal findings Age-appropriate  preventive screenings and vaccinations discussed, annual physical exam completed. Routine labs for health maintenance done in late November, results discussed at today's office visit. PHM updated.    2. Primary hypertension Blood pressure remains poorly controlled, lisinopril dose increased to 40 mg daily.  Follow-up in 1  month - lisinopril (ZESTRIL) 30 MG tablet; Take 1 tablet (30 mg total) by mouth daily.  Dispense: 30 tablet; Refill: 2   3. Generalized osteoarthritis Manageable with current over-the-counter and prescription medications.   4. Dysuria Routine urinalysis done - UA/M w/rflx Culture, Routine - Microscopic Examination - Urine Culture, Reflex   5. Attention deficit disorder (ADD) in adult Not currently taking medications at this time     General Counseling: jeralynn vaquera understanding of the findings of todays visit and agrees with plan of treatment. I have discussed any further diagnostic evaluation that may be needed or ordered today. We also reviewed her medications today. she has been encouraged to call the office with any questions or concerns that should arise related to todays visit.    Orders Placed This Encounter  Procedures   Microscopic Examination   Urine Culture, Reflex   UA/M w/rflx Culture, Routine    Meds ordered this encounter  Medications   lisinopril (ZESTRIL) 30 MG tablet    Sig: Take 1 tablet (30 mg total) by mouth daily.    Dispense:  30 tablet    Refill:  2    Please note dose change, discontinue 10 mg lisinopril tablet.    Return in about 4 weeks (around 11/19/2021) for F/U, BP check, Keeshawn Fakhouri PCP.   Total time spent:30 Minutes Time spent includes review of chart, medications, test results, and follow up plan with the patient.   [place] Controlled Substance Database was reviewed by me.  This patient was seen by Jonetta Osgood, FNP-C in collaboration with Dr. Clayborn Bigness as a part of collaborative care agreement.  Loetta Connelley R. Valetta Fuller, MSN, FNP-C Internal medicine

## 2021-11-19 ENCOUNTER — Ambulatory Visit: Payer: BC Managed Care – PPO | Admitting: Nurse Practitioner

## 2021-11-19 ENCOUNTER — Other Ambulatory Visit: Payer: Self-pay

## 2021-11-19 ENCOUNTER — Encounter: Payer: Self-pay | Admitting: Nurse Practitioner

## 2021-11-19 VITALS — BP 120/80 | HR 85 | Temp 98.3°F | Resp 16 | Ht 66.5 in | Wt 177.0 lb

## 2021-11-19 DIAGNOSIS — R609 Edema, unspecified: Secondary | ICD-10-CM

## 2021-11-19 DIAGNOSIS — M159 Polyosteoarthritis, unspecified: Secondary | ICD-10-CM | POA: Diagnosis not present

## 2021-11-19 DIAGNOSIS — I10 Essential (primary) hypertension: Secondary | ICD-10-CM | POA: Diagnosis not present

## 2021-11-19 NOTE — Progress Notes (Signed)
[facility] [place], [place] [address]  Internal MEDICINE  Office Visit Note  Patient Name: April Wilkins  [id number]  [id number]  Date of Service: 11/19/2021  Chief Complaint  Patient presents with   Follow-up   Hypertension    HPI April Wilkins presents for a follow up visit for hypertension. At her previous office visit, her lisinopril was increased to 30 mg daily which has improved her blood pressure and it is now stable.     Current Medication: Outpatient Encounter Medications as of 11/19/2021  Medication Sig   hydrochlorothiazide (HYDRODIURIL) 12.5 MG tablet Take 1 tablet (12.5 mg total) by mouth daily.   meloxicam (MOBIC) 15 MG tablet Take 1 tablet (15 mg total) by mouth daily.   [DISCONTINUED] lisinopril (ZESTRIL) 30 MG tablet Take 1 tablet (30 mg total) by mouth daily.   [DISCONTINUED] amphetamine-dextroamphetamine (ADDERALL XR) 30 MG 24 hr capsule Take 1 capsule (30 mg total) by mouth every morning. (Patient not taking: Reported on 10/22/2021)   [DISCONTINUED] nitrofurantoin, macrocrystal-monohydrate, (MACROBID) 100 MG capsule Take 1 capsule (100 mg total) by mouth 2 (two) times daily. (Patient not taking: Reported on 11/19/2021)   No facility-administered encounter medications on file as of 11/19/2021.    Surgical History: Past Surgical History:  Procedure Laterality Date   SKIN BIOPSY      Medical History: Past Medical History:  Diagnosis Date   Actinic keratosis 08/31/2010   Right forearm. Pigmented.   Allergy    Seasonal   Basal cell carcinoma (BCC) of right upper arm 02/08/2010   Right deltoid. Superficial.    Chicken pox    Dysplastic nevus 02/08/2010   R mid back bra line. Moderate to severe atypia, close to margin. Excised 03/28/2010, margins free.   Dysplastic nevus 02/08/2010   Left mid to low back above MM site. Moderate atypia, close to margin   Dysplastic nevus 02/08/2010   Left sup. lat. buttocks near side. Moderate atypia,  close to margin.   Dysplastic nevus 05/22/2015   Left lower lat. back. Moderat to severe atypia, close to margin. Excised 06/12/2015, margins free.   Dysplastic nevus 03/31/2018   Central upper abdomen. Moderate atypia, limited margins free.   Frequent headaches    Hypertension    Melanoma ([place]) 2010   left lower back   Migraines    Squamous cell carcinoma of skin    back   Squamous cell carcinoma of skin 12/20/2020   L upper arm, SCC IS, EDC 01/17/21    Family History: Family History  Problem Relation Age of Onset   Hyperlipidemia Father    Heart disease Father    Diabetes Father    Cancer Father        Skin Cancer   Alcohol abuse Paternal Uncle    Hyperlipidemia Paternal Uncle    Heart disease Paternal Uncle    Kidney disease Maternal Grandfather    Hyperlipidemia Paternal Grandmother    Heart disease Paternal Grandmother    Drug abuse Paternal Grandmother    Breast cancer Maternal Grandmother 70    Social History   Socioeconomic History   Marital status: Single    Spouse name: Not on file   Number of children: Not on file   Years of education: Not on file   Highest education level: Not on file  Occupational History   Not on file  Tobacco Use   Smoking status: Never   Smokeless tobacco: Never  Substance and Sexual Activity   Alcohol  use: Yes    Alcohol/week: 0.0 standard drinks    Comment: Rare    Drug use: No   Sexual activity: Yes    Partners: Male  Other Topics Concern   Not on file  Social History Narrative   Divorced   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     Social Determinants of Health   Financial Resource Strain: Not on file  Food Insecurity: Not on file  Transportation Needs: Not on file  Physical Activity: Not on file  Stress: Not on file  Social Connections: Not on file  Intimate Partner Violence: Not on file      Review of Systems   Constitutional:  Negative for chills, fatigue and unexpected weight change.  HENT:  Negative for congestion, rhinorrhea, sneezing and sore throat.   Eyes:  Negative for redness.  Respiratory:  Negative for cough, chest tightness and shortness of breath.   Cardiovascular:  Negative for chest pain and palpitations.  Gastrointestinal:  Negative for abdominal pain, constipation, diarrhea, nausea and vomiting.  Genitourinary:  Negative for dysuria and frequency.  Musculoskeletal:  Negative for arthralgias, back pain, joint swelling and neck pain.  Skin:  Negative for rash.  Neurological: Negative.  Negative for tremors and numbness.  Hematological:  Negative for adenopathy. Does not bruise/bleed easily.  Psychiatric/Behavioral:  Negative for behavioral problems (Depression), sleep disturbance and suicidal ideas. The patient is not nervous/anxious.    Vital Signs: BP 120/80 Comment: 150/88   Pulse 85    Temp 98.3 F (36.8 C)    Resp 16    Ht 5' 6.5" (1.689 m)    Wt 177 lb (80.3 kg)    SpO2 99%    BMI 28.14 kg/m    Physical Exam Vitals reviewed.  Constitutional:      General: She is not in acute distress.    Appearance: Normal appearance. She is not ill-appearing.  HENT:     Head: Normocephalic and atraumatic.  Eyes:     Pupils: Pupils are equal, round, and reactive to light.  Cardiovascular:     Rate and Rhythm: Normal rate and regular rhythm.  Pulmonary:     Effort: Pulmonary effort is normal. No respiratory distress.  Neurological:     Mental Status: She is alert and oriented to person, place, and time.     Cranial Nerves: No cranial nerve deficit.     Coordination: Coordination normal.     Gait: Gait normal.  Psychiatric:        Mood and Affect: Mood normal.        Behavior: Behavior normal.       Assessment/Plan: 1. Primary hypertension Blood pressure well controlled with lisinopril 30 mg daily. Continue as prescribed.  2. Water retention HCTZ added due to patient  retaining fluid while on lisinopril.  - hydrochlorothiazide (HYDRODIURIL) 12.5 MG tablet; Take 1 tablet (12.5 mg total) by mouth daily.  Dispense: 30 tablet; Refill: 2  3. Generalized osteoarthritis Stable and manageable with meloxicam and OTC medications.     General Counseling: forever arechiga understanding of the findings of todays visit and agrees with plan of treatment. I have discussed any further diagnostic evaluation that may be needed or ordered today. We also reviewed her medications today. she has been encouraged to call the office with any questions or concerns that should arise related to todays visit.  No orders of the defined types were placed in this encounter.   Meds ordered this encounter  Medications   hydrochlorothiazide (HYDRODIURIL) 12.5 MG tablet    Sig: Take 1 tablet (12.5 mg total) by mouth daily.    Dispense:  30 tablet    Refill:  2    Return in about 6 months (around 05/19/2022) for F/U, med refill, BP check, Emanuelle Hammerstrom PCP.   Total time spent:30 Minutes Time spent includes review of chart, medications, test results, and follow up plan with the patient.   [place] Controlled Substance Database was reviewed by me.  This patient was seen by Jonetta Osgood, FNP-C in collaboration with Dr. Clayborn Bigness as a part of collaborative care agreement.   Gladie Gravette R. Valetta Fuller, MSN, FNP-C Internal medicine

## 2021-11-20 ENCOUNTER — Ambulatory Visit
Admission: RE | Admit: 2021-11-20 | Discharge: 2021-11-20 | Disposition: A | Discharge status: Home / Self Care | Payer: BC Managed Care – PPO | Source: Ambulatory Visit | Attending: Internal Medicine | Admitting: Internal Medicine

## 2021-11-20 DIAGNOSIS — Z1231 Encounter for screening mammogram for malignant neoplasm of breast: Secondary | ICD-10-CM | POA: Insufficient documentation

## 2021-11-20 DIAGNOSIS — Z1239 Encounter for other screening for malignant neoplasm of breast: Secondary | ICD-10-CM

## 2021-11-21 ENCOUNTER — Other Ambulatory Visit: Payer: Self-pay | Admitting: Nurse Practitioner

## 2021-11-21 DIAGNOSIS — I10 Essential (primary) hypertension: Secondary | ICD-10-CM

## 2021-11-28 ENCOUNTER — Encounter: Payer: Self-pay | Admitting: Nurse Practitioner

## 2021-11-28 MED ORDER — HYDROCHLOROTHIAZIDE 12.5 MG PO TABS: 12.5000 mg | ORAL_TABLET | Freq: Every day | ORAL | 2 refills | Status: DC

## 2021-12-22 ENCOUNTER — Encounter: Payer: Self-pay | Admitting: Nurse Practitioner

## 2021-12-31 ENCOUNTER — Other Ambulatory Visit: Payer: Self-pay

## 2021-12-31 ENCOUNTER — Ambulatory Visit (INDEPENDENT_AMBULATORY_CARE_PROVIDER_SITE_OTHER): Payer: BC Managed Care – PPO | Admitting: Dermatology

## 2021-12-31 ENCOUNTER — Encounter: Payer: Self-pay | Admitting: Dermatology

## 2021-12-31 DIAGNOSIS — B009 Herpesviral infection, unspecified: Secondary | ICD-10-CM

## 2021-12-31 DIAGNOSIS — D229 Melanocytic nevi, unspecified: Secondary | ICD-10-CM

## 2021-12-31 DIAGNOSIS — Z1283 Encounter for screening for malignant neoplasm of skin: Secondary | ICD-10-CM | POA: Diagnosis not present

## 2021-12-31 DIAGNOSIS — Z85820 Personal history of malignant melanoma of skin: Secondary | ICD-10-CM

## 2021-12-31 DIAGNOSIS — Z85828 Personal history of other malignant neoplasm of skin: Secondary | ICD-10-CM | POA: Diagnosis not present

## 2021-12-31 DIAGNOSIS — D225 Melanocytic nevi of trunk: Secondary | ICD-10-CM | POA: Diagnosis not present

## 2021-12-31 DIAGNOSIS — D1800 Hemangioma unspecified site: Secondary | ICD-10-CM

## 2021-12-31 DIAGNOSIS — L570 Actinic keratosis: Secondary | ICD-10-CM

## 2021-12-31 DIAGNOSIS — L820 Inflamed seborrheic keratosis: Secondary | ICD-10-CM | POA: Diagnosis not present

## 2021-12-31 DIAGNOSIS — L821 Other seborrheic keratosis: Secondary | ICD-10-CM

## 2021-12-31 DIAGNOSIS — L814 Other melanin hyperpigmentation: Secondary | ICD-10-CM

## 2021-12-31 DIAGNOSIS — L578 Other skin changes due to chronic exposure to nonionizing radiation: Secondary | ICD-10-CM

## 2021-12-31 DIAGNOSIS — Z86018 Personal history of other benign neoplasm: Secondary | ICD-10-CM

## 2021-12-31 MED ORDER — VALACYCLOVIR HCL 1 G PO TABS: ORAL_TABLET | ORAL | 11 refills | Status: DC

## 2021-12-31 NOTE — Patient Instructions (Addendum)
Cryotherapy Aftercare  Wash gently with soap and water everyday.   Apply Vaseline and Band-Aid daily until healed.   Prior to procedure, discussed risks of blister formation, small wound, skin dyspigmentation, or rare scar following cryotherapy. Recommend Vaseline ointment to treated areas while healing.   Recommend daily broad spectrum sunscreen SPF 30+ to sun-exposed areas, reapply every 2 hours as needed. Call for new or changing lesions.  Staying in the shade or wearing long sleeves, sun glasses (UVA+UVB protection) and wide brim hats (4-inch brim around the entire circumference of the hat) are also recommended for sun protection.      Melanoma ABCDEs  Melanoma is the most dangerous type of skin cancer, and is the leading cause of death from skin disease.  You are more likely to develop melanoma if you: Have light-colored skin, light-colored eyes, or red or blond hair Spend a lot of time in the sun Tan regularly, either outdoors or in a tanning bed Have had blistering sunburns, especially during childhood Have a close family member who has had a melanoma Have atypical moles or large birthmarks  Early detection of melanoma is key since treatment is typically straightforward and cure rates are extremely high if we catch it early.   The first sign of melanoma is often a change in a mole or a new dark spot.  The ABCDE system is a way of remembering the signs of melanoma.  A for asymmetry:  The two halves do not match. B for border:  The edges of the growth are irregular. C for color:  A mixture of colors are present instead of an even brown color. D for diameter:  Melanomas are usually (but not always) greater than 2mm - the size of a pencil eraser. E for evolution:  The spot keeps changing in size, shape, and color.  Please check your skin once per month between visits. You can use a small mirror in front and a large mirror behind you to keep an eye on the back side or your body.    If you see any new or changing lesions before your next follow-up, please call to schedule a visit.  Please continue daily skin protection including broad spectrum sunscreen SPF 30+ to sun-exposed areas, reapplying every 2 hours as needed when you're outdoors.   Staying in the shade or wearing long sleeves, sun glasses (UVA+UVB protection) and wide brim hats (4-inch brim around the entire circumference of the hat) are also recommended for sun protection.    If You Need Anything After Your Visit  If you have any questions or concerns for your doctor, please call our main line at [phone] and press option 4 to reach your doctor's medical assistant. If no one answers, please leave a voicemail as directed and we will return your call as soon as possible. Messages left after 4 pm will be answered the following business day.   You may also send [place] a message via [place]. We typically respond to MyChart messages within 1-2 business days.  For prescription refills, please ask your pharmacy to contact our office. Our fax number is [phone].  If you have an urgent issue when the clinic is closed that cannot wait until the next business day, you can page your doctor at the number below.    Please note that while we do our best to be available for urgent issues outside of office hours, we are not available 24/7.   If you have an urgent issue and  are unable to reach [place], you may choose to seek medical care at your doctor's office, retail clinic, urgent care center, or emergency room.  If you have a medical emergency, please immediately call 911 or go to the emergency department.  Pager Numbers  - Dr. Nehemiah Massed: [phone]  - Dr. Laurence Ferrari: [phone]  - Dr. Nicole Kindred: [phone]  In the event of inclement weather, please call our main line at [phone] for an update on the status of any delays or closures.  Dermatology Medication Tips: Please keep the boxes that topical medications  come in in order to help keep track of the instructions about where and how to use these. Pharmacies typically print the medication instructions only on the boxes and not directly on the medication tubes.   If your medication is too expensive, please contact our office at [phone] option 4 or send [place] a message through [place].   We are unable to tell what your co-pay for medications will be in advance as this is different depending on your insurance coverage. However, we may be able to find a substitute medication at lower cost or fill out paperwork to get insurance to cover a needed medication.   If a prior authorization is required to get your medication covered by your insurance company, please allow [place] 1-2 business days to complete this process.  Drug prices often vary depending on where the prescription is filled and some pharmacies may offer cheaper prices.  The website www.goodrx.com contains coupons for medications through different pharmacies. The prices here do not account for what the cost may be with help from insurance (it may be cheaper with your insurance), but the website can give you the price if you did not use any insurance.  - You can print the associated coupon and take it with your prescription to the pharmacy.  - You may also stop by our office during regular business hours and pick up a GoodRx coupon card.  - If you need your prescription sent electronically to a different pharmacy, notify our office through [facility] or by phone at [phone] option 4.     Si Usted Necesita Algo Despus de Su Visita  Tambin puede enviarnos un mensaje a travs de Pharmacist, community. Por lo general respondemos a los mensajes de MyChart en el transcurso de 1 a 2 das hbiles.  Para renovar recetas, por favor pida a su farmacia que se ponga en contacto con nuestra oficina. Harland Dingwall de fax es [place] [phone].  Si tiene un asunto urgente cuando la clnica est cerrada y que no  puede esperar hasta el siguiente da hbil, puede llamar/localizar a su doctor(a) al nmero que aparece a continuacin.   Por favor, tenga en cuenta que aunque hacemos todo lo posible para estar disponibles para asuntos urgentes fuera del horario de [place], no estamos disponibles las 24 horas del da, los 7 das de la [place].   Si tiene un problema urgente y no puede comunicarse con nosotros, puede optar por buscar atencin mdica  en el consultorio de su doctor(a), en una clnica privada, en un centro de atencin urgente o en una sala de emergencias.  Si tiene Engineering geologist, por favor llame inmediatamente al 911 o vaya a la sala de emergencias.  Nmeros de bper  - Dr. Nehemiah Massed: [phone]  - Dra. Moye: [phone]  - Dra. Nicole Kindred: [phone]  En caso de inclemencias del [place], por favor llame a nuestra lnea principal al [phone] para una actualizacin [employer]  estado de cualquier retraso o cierre.  Consejos para la medicacin en dermatologa: Por favor, guarde las cajas en las que vienen los medicamentos de uso tpico para ayudarle a seguir las instrucciones sobre dnde y cmo usarlos. Las farmacias generalmente imprimen las instrucciones del medicamento slo en las cajas y no directamente en los tubos del [place].   Si su medicamento es muy caro, por favor, pngase en contacto con Zigmund Daniel llamando al [phone] y presione la opcin 4 o envenos un mensaje a travs de Pharmacist, community.   No podemos decirle cul ser su copago por los medicamentos por adelantado ya que esto es diferente dependiendo de la cobertura de su seguro. Sin embargo, es posible que podamos encontrar un medicamento sustituto a Electrical engineer un formulario para que el seguro cubra el medicamento que se considera necesario.   Si se requiere una autorizacin previa para que su compaa de seguros [place] su medicamento, por favor permtanos de 1 a 2 das hbiles para completar este  proceso.  Los precios de los medicamentos varan con frecuencia dependiendo del Environmental consultant de dnde se surte la receta y alguna farmacias pueden ofrecer precios ms baratos.  El sitio web www.goodrx.com tiene cupones para medicamentos de Airline pilot. Los precios aqu no tienen en cuenta lo que podra costar con la ayuda del seguro (puede ser ms barato con su seguro), pero el sitio web puede darle el precio si no utiliz Research scientist (physical sciences).  - Puede imprimir el cupn correspondiente y llevarlo con su receta a la farmacia.  - Tambin puede pasar por nuestra oficina durante el horario de atencin regular y Charity fundraiser una tarjeta de cupones de GoodRx.  - Si necesita que su receta se enve electrnicamente a una farmacia diferente, informe a nuestra oficina a travs de MyChart de [place] o por telfono llamando al [phone] y presione la opcin 4.

## 2021-12-31 NOTE — Progress Notes (Signed)
Follow-Up Visit   Subjective  April Wilkins is a 61 y.o. female who presents for the following: Annual Exam (Here for skin cancer screening. Full body. HxDN, MM, SCC, BCC).  The patient presents for Total-Body Skin Exam (TBSE) for skin cancer screening and mole check.  The patient has spots, moles and lesions to be evaluated, some may be new or changing and the patient has concerns that these could be cancer.   The following portions of the chart were reviewed this encounter and updated as appropriate:      Review of Systems: No other skin or systemic complaints except as noted in HPI or Assessment and Plan.   Objective  Well appearing patient in no apparent distress; mood and affect are within normal limits.  A full examination was performed including scalp, head, eyes, ears, nose, lips, neck, chest, axillae, abdomen, back, buttocks, bilateral upper extremities, bilateral lower extremities, hands, feet, fingers, toes, fingernails, and toenails. All findings within normal limits unless otherwise noted below.  Left Abdomen (side) - Lower, Left Flank, Spinal Upper Back 45mm medium dark brown macule at left flank  2.34mm medium dark brown macule at left abdomen  5.54mm pink flesh papule at spinal upper back      left mid back x1, right upper arm x3, Left Upper Vermilion Lip x1 (5) Pink scaly macules  Right Shoulder - Anterior x1 Erythematous keratotic or waxy stuck-on papule   Right Upper Vermilion Lip No active lesions on clinical exam today   Assessment & Plan   Lentigines - Scattered tan macules - Due to sun exposure - Benign-appearing, observe - Recommend daily broad spectrum sunscreen SPF 30+ to sun-exposed areas, reapply every 2 hours as needed. - Call for any changes  Seborrheic Keratoses - Stuck-on, waxy, tan-brown papules and/or plaques  - Benign-appearing - Discussed benign etiology and prognosis. - Observe - Call for any changes  Melanocytic  Nevi - Tan-brown and/or pink-flesh-colored symmetric macules and papules - Benign appearing on exam today - Observation - Call clinic for new or changing moles - Recommend daily use of broad spectrum spf 30+ sunscreen to sun-exposed areas.   Hemangiomas - Red papules - Discussed benign nature - Observe - Call for any changes  Actinic Damage - Chronic condition, secondary to cumulative UV/sun exposure - diffuse scaly erythematous macules with underlying dyspigmentation - Recommend daily broad spectrum sunscreen SPF 30+ to sun-exposed areas, reapply every 2 hours as needed.  - Staying in the shade or wearing long sleeves, sun glasses (UVA+UVB protection) and wide brim hats (4-inch brim around the entire circumference of the hat) are also recommended for sun protection.  - Call for new or changing lesions.  History of Melanoma 2010 - No evidence of recurrence today at left lower back - Recommend regular full body skin exams - Recommend daily broad spectrum sunscreen SPF 30+ to sun-exposed areas, reapply every 2 hours as needed.  - Call if any new or changing lesions are noted between office visits   History of Dysplastic Nevi - No evidence of recurrence today - Recommend regular full body skin exams - Recommend daily broad spectrum sunscreen SPF 30+ to sun-exposed areas, reapply every 2 hours as needed.  - Call if any new or changing lesions are noted between office visits   History of Basal Cell Carcinoma of the Skin - No evidence of recurrence today - Recommend regular full body skin exams - Recommend daily broad spectrum sunscreen SPF 30+ to sun-exposed areas, reapply every  2 hours as needed.  - Call if any new or changing lesions are noted between office visits   History of Squamous Cell Carcinoma of the Skin 2/22 - No evidence of recurrence today L upper arm - Recommend regular full body skin exams - Recommend daily broad spectrum sunscreen SPF 30+ to sun-exposed areas,  reapply every 2 hours as needed.  - Call if any new or changing lesions are noted between office visits   Skin cancer screening performed today.  Nevus (3) Left Abdomen (side) - Lower; Left Flank; Spinal Upper Back  Benign-appearing.  Stable. Observation.  Call clinic for new or changing lesions.  Recommend daily use of broad spectrum spf 30+ sunscreen to sun-exposed areas.    Actinic keratosis (5) left mid back x1, right upper arm x3, Left Upper Vermilion Lip x1  Recheck left mid back on follow up.  Actinic keratoses are precancerous spots that appear secondary to cumulative UV radiation exposure/sun exposure over time. They are chronic with expected duration over 1 year. A portion of actinic keratoses will progress to squamous cell carcinoma of the skin. It is not possible to reliably predict which spots will progress to skin cancer and so treatment is recommended to prevent development of skin cancer.  Recommend daily broad spectrum sunscreen SPF 30+ to sun-exposed areas, reapply every 2 hours as needed.  Recommend staying in the shade or wearing long sleeves, sun glasses (UVA+UVB protection) and wide brim hats (4-inch brim around the entire circumference of the hat). Call for new or changing lesions.   Destruction of lesion - left mid back x1, right upper arm x3, Left Upper Vermilion Lip x1  Destruction method: cryotherapy   Informed consent: discussed and consent obtained   Lesion destroyed using liquid nitrogen: Yes   Region frozen until ice ball extended beyond lesion: Yes   Outcome: patient tolerated procedure well with no complications   Post-procedure details: wound care instructions given   Additional details:  Prior to procedure, discussed risks of blister formation, small wound, skin dyspigmentation, or rare scar following cryotherapy. Recommend Vaseline ointment to treated areas while healing.   Inflamed seborrheic keratosis Right Shoulder - Anterior x1  Destruction of  lesion - Right Shoulder - Anterior x1  Destruction method: cryotherapy   Informed consent: discussed and consent obtained   Lesion destroyed using liquid nitrogen: Yes   Region frozen until ice ball extended beyond lesion: Yes   Outcome: patient tolerated procedure well with no complications   Post-procedure details: wound care instructions given   Additional details:  Prior to procedure, discussed risks of blister formation, small wound, skin dyspigmentation, or rare scar following cryotherapy. Recommend Vaseline ointment to treated areas while healing.   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 Condition is symptomatic/ bothersome to patient. Needs rfs.  Herpes Simplex Virus = Cold Sores = Fever Blisters is a chronic recurring blistering; scabbing sore-producing viral infection that is recurrent usually in the same area triggered by stress, sun/UV exposure and trauma.  It is infectious and can be spread from person to person by direct contact.  It is not curable, but is treatable with topical and oral medication.   Take Valacyclovir as directed  valACYclovir (VALTREX) 1000 MG tablet - Right Upper Vermilion Lip Take 2 tablets at first sign of symptoms, repeat in 12 hours for 1 day dose   Return in about 1 year (around 12/31/2022) for TBSE, [place] Follow  Up-sun exposed check in 6 months.  I, Emelia Salisbury, CMA, am acting as scribe for Brendolyn Patty, MD.  Documentation: I have reviewed the above documentation for accuracy and completeness, and I agree with the above.  Brendolyn Patty MD

## 2022-02-11 ENCOUNTER — Encounter: Payer: Self-pay | Admitting: Nurse Practitioner

## 2022-02-11 DIAGNOSIS — F988 Other specified behavioral and emotional disorders with onset usually occurring in childhood and adolescence: Secondary | ICD-10-CM

## 2022-02-16 MED ORDER — AMPHETAMINE-DEXTROAMPHET ER 30 MG PO CP24: 30.0000 mg | ORAL_CAPSULE | ORAL | 0 refills | Status: DC

## 2022-03-06 ENCOUNTER — Ambulatory Visit: Payer: BC Managed Care – PPO | Admitting: Nurse Practitioner

## 2022-03-06 ENCOUNTER — Encounter: Payer: Self-pay | Admitting: Nurse Practitioner

## 2022-03-06 VITALS — BP 120/60 | HR 68 | Temp 98.1°F | Resp 16 | Ht 66.5 in | Wt 176.4 lb

## 2022-03-06 DIAGNOSIS — I10 Essential (primary) hypertension: Secondary | ICD-10-CM | POA: Diagnosis not present

## 2022-03-06 DIAGNOSIS — M5442 Lumbago with sciatica, left side: Secondary | ICD-10-CM

## 2022-03-06 DIAGNOSIS — R609 Edema, unspecified: Secondary | ICD-10-CM

## 2022-03-06 DIAGNOSIS — F988 Other specified behavioral and emotional disorders with onset usually occurring in childhood and adolescence: Secondary | ICD-10-CM | POA: Diagnosis not present

## 2022-03-06 DIAGNOSIS — M5441 Lumbago with sciatica, right side: Secondary | ICD-10-CM | POA: Diagnosis not present

## 2022-03-06 DIAGNOSIS — G8929 Other chronic pain: Secondary | ICD-10-CM

## 2022-03-06 MED ORDER — MELOXICAM 15 MG PO TABS: 15.0000 mg | ORAL_TABLET | Freq: Every day | ORAL | 1 refills | Status: DC

## 2022-03-06 MED ORDER — HYDROCHLOROTHIAZIDE 12.5 MG PO TABS: 12.5000 mg | ORAL_TABLET | Freq: Every day | ORAL | 3 refills | Status: DC

## 2022-03-06 MED ORDER — AMPHETAMINE-DEXTROAMPHET ER 30 MG PO CP24: 30.0000 mg | ORAL_CAPSULE | ORAL | 0 refills | Status: DC

## 2022-03-06 MED ORDER — LISINOPRIL 30 MG PO TABS: 30.0000 mg | ORAL_TABLET | Freq: Every day | ORAL | 3 refills | Status: DC

## 2022-03-06 NOTE — Progress Notes (Signed)
[facility] [place], [place] [address]  Internal MEDICINE  Office Visit Note  Patient Name: April Wilkins  [id number]  [id number]  Date of Service: 03/06/2022  Chief Complaint  Patient presents with   Follow-up   Hypertension   Medication Refill    HPI April Wilkins presents for follow-up visit for hypertension, ADHD and medication refills.  In between office visits, April Wilkins sent a MyChart message requesting to be restarted on her ADHD medication since her blood pressure is now well under control.  This was deemed appropriate and a prescription for Adderall was sent to her pharmacy.  She reports today that since restarting the Adderall she is feeling better, more focused and able to concentrate on tasks and complete them.  She denies any palpitations or other adverse side effects of Adderall. Her blood pressure remains stable and well-controlled with current medication.  And her heart rate is also within normal limits She reports that she has started working out at the gym again and is moving around more and has been eating a healthier diet.  Although her chart shows that she is only lost 1 pound since her previous office visit in January, patient reports that her weight did increase between today and her previous office visit in January and she is actually lost weight since then.     Current Medication: Outpatient Encounter Medications as of 03/06/2022  Medication Sig   valACYclovir (VALTREX) 1000 MG tablet Take 2 tablets at first sign of symptoms, repeat in 12 hours for 1 day dose   [DISCONTINUED] amphetamine-dextroamphetamine (ADDERALL XR) 30 MG 24 hr capsule Take 1 capsule (30 mg total) by mouth every morning.   [DISCONTINUED] hydrochlorothiazide (HYDRODIURIL) 12.5 MG tablet Take 1 tablet (12.5 mg total) by mouth daily.   [DISCONTINUED] lisinopril (ZESTRIL) 30 MG tablet TAKE 1 TABLET(30 MG) BY MOUTH DAILY   [DISCONTINUED] meloxicam (MOBIC) 15 MG tablet Take 1  tablet (15 mg total) by mouth daily.   amphetamine-dextroamphetamine (ADDERALL XR) 30 MG 24 hr capsule Take 1 capsule (30 mg total) by mouth every morning.   [START ON 04/17/2022] amphetamine-dextroamphetamine (ADDERALL XR) 30 MG 24 hr capsule Take 1 capsule (30 mg total) by mouth every morning.   [START ON 05/15/2022] amphetamine-dextroamphetamine (ADDERALL XR) 30 MG 24 hr capsule Take 1 capsule (30 mg total) by mouth every morning.   hydrochlorothiazide (HYDRODIURIL) 12.5 MG tablet Take 1 tablet (12.5 mg total) by mouth daily.   lisinopril (ZESTRIL) 30 MG tablet Take 1 tablet (30 mg total) by mouth daily.   meloxicam (MOBIC) 15 MG tablet Take 1 tablet (15 mg total) by mouth daily.   No facility-administered encounter medications on file as of 03/06/2022.    Surgical History: Past Surgical History:  Procedure Laterality Date   SKIN BIOPSY      Medical History: Past Medical History:  Diagnosis Date   Actinic keratosis 08/31/2010   Right forearm. Pigmented.   Allergy    Seasonal   Basal cell carcinoma (BCC) of right upper arm 02/08/2010   Right deltoid. Superficial.    Chicken pox    Dysplastic nevus 02/08/2010   R mid back bra line. Moderate to severe atypia, close to margin. Excised 03/28/2010, margins free.   Dysplastic nevus 02/08/2010   Left mid to low back above MM site. Moderate atypia, close to margin   Dysplastic nevus 02/08/2010   Left sup. lat. buttocks near side. Moderate atypia, close to margin.   Dysplastic nevus 05/22/2015   Left  lower lat. back. Moderat to severe atypia, close to margin. Excised 06/12/2015, margins free.   Dysplastic nevus 03/31/2018   Central upper abdomen. Moderate atypia, limited margins free.   Frequent headaches    Hypertension    Melanoma ([place]) 2010   left lower back   Migraines    Squamous cell carcinoma of skin    back   Squamous cell carcinoma of skin 12/20/2020   L upper arm, SCC IS, EDC 01/17/21    Family History: Family History   Problem Relation Age of Onset   Hyperlipidemia Father    Heart disease Father    Diabetes Father    Cancer Father        Skin Cancer   Alcohol abuse Paternal Uncle    Hyperlipidemia Paternal Uncle    Heart disease Paternal Uncle    Kidney disease Maternal Grandfather    Hyperlipidemia Paternal Grandmother    Heart disease Paternal Grandmother    Drug abuse Paternal Grandmother    Breast cancer Maternal Grandmother 70    Social History   Socioeconomic History   Marital status: Single    Spouse name: Not on file   Number of children: Not on file   Years of education: Not on file   Highest education level: Not on file  Occupational History   Not on file  Tobacco Use   Smoking status: Never   Smokeless tobacco: Never  Substance and Sexual Activity   Alcohol use: Yes    Alcohol/week: 0.0 standard drinks    Comment: Rare    Drug use: No   Sexual activity: Yes    Partners: Male  Other Topics Concern   Not on file  Social History Narrative   Divorced   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     Social Determinants of Health   Financial Resource Strain: Not on file  Food Insecurity: Not on file  Transportation Needs: Not on file  Physical Activity: Not on file  Stress: Not on file  Social Connections: Not on file  Intimate Partner Violence: Not on file      Review of Systems  Constitutional:  Negative for chills, fatigue and unexpected weight change.  HENT:  Negative for congestion, rhinorrhea, sneezing and sore throat.   Eyes:  Negative for redness.  Respiratory:  Negative for cough, chest tightness and shortness of breath.   Cardiovascular:  Negative for chest pain and palpitations.  Gastrointestinal:  Negative for abdominal pain, constipation, diarrhea, nausea and vomiting.  Genitourinary:  Negative for dysuria and frequency.  Musculoskeletal:  Negative for arthralgias,  back pain, joint swelling and neck pain.  Skin:  Negative for rash.  Neurological: Negative.  Negative for tremors and numbness.  Hematological:  Negative for adenopathy. Does not bruise/bleed easily.  Psychiatric/Behavioral:  Negative for behavioral problems (Depression), sleep disturbance and suicidal ideas. The patient is not nervous/anxious.    Vital Signs: BP 120/60   Pulse 68   Temp 98.1 F (36.7 C)   Resp 16   Ht 5' 6.5" (1.689 m)   Wt 176 lb 6.4 oz (80 kg)   SpO2 97%   BMI 28.05 kg/m    Physical Exam Vitals reviewed.  Constitutional:      General: She is not in acute distress.    Appearance: Normal appearance. She is not ill-appearing.  HENT:  Head: Normocephalic and atraumatic.  Eyes:     Pupils: Pupils are equal, round, and reactive to light.  Cardiovascular:     Rate and Rhythm: Normal rate and regular rhythm.  Pulmonary:     Effort: Pulmonary effort is normal. No respiratory distress.  Neurological:     Mental Status: She is alert and oriented to person, place, and time.  Psychiatric:        Mood and Affect: Mood normal.        Behavior: Behavior normal.       Assessment/Plan: 1. Primary hypertension Stable well-controlled with current medications, refills ordered. - hydrochlorothiazide (HYDRODIURIL) 12.5 MG tablet; Take 1 tablet (12.5 mg total) by mouth daily.  Dispense: 90 tablet; Refill: 3 - lisinopril (ZESTRIL) 30 MG tablet; Take 1 tablet (30 mg total) by mouth daily.  Dispense: 90 tablet; Refill: 3  2. Water retention Continue hydrochlorothiazide as prescribed - hydrochlorothiazide (HYDRODIURIL) 12.5 MG tablet; Take 1 tablet (12.5 mg total) by mouth daily.  Dispense: 90 tablet; Refill: 3  3. Chronic midline low back pain with bilateral sciatica May continue meloxicam as prescribed - meloxicam (MOBIC) 15 MG tablet; Take 1 tablet (15 mg total) by mouth daily.  Dispense: 30 tablet; Refill: 1  4. Attention deficit disorder (ADD) in  adult Current dose remains effective, patient denies any adverse side effects of the medication.  Refills x3 months sent to pharmacy.  Follow-up in 3 months for additional refills.  Will need urine drug screen at next office visit, patient made aware. - amphetamine-dextroamphetamine (ADDERALL XR) 30 MG 24 hr capsule; Take 1 capsule (30 mg total) by mouth every morning.  Dispense: 30 capsule; Refill: 0 - amphetamine-dextroamphetamine (ADDERALL XR) 30 MG 24 hr capsule; Take 1 capsule (30 mg total) by mouth every morning.  Dispense: 30 capsule; Refill: 0 - amphetamine-dextroamphetamine (ADDERALL XR) 30 MG 24 hr capsule; Take 1 capsule (30 mg total) by mouth every morning.  Dispense: 30 capsule; Refill: 0   General Counseling: April Wilkins verbalizes understanding of the findings of todays visit and agrees with plan of treatment. I have discussed any further diagnostic evaluation that may be needed or ordered today. We also reviewed her medications today. she has been encouraged to call the office with any questions or concerns that should arise related to todays visit.    No orders of the defined types were placed in this encounter.   Meds ordered this encounter  Medications   amphetamine-dextroamphetamine (ADDERALL XR) 30 MG 24 hr capsule    Sig: Take 1 capsule (30 mg total) by mouth every morning.    Dispense:  30 capsule    Refill:  0    Fill for may   amphetamine-dextroamphetamine (ADDERALL XR) 30 MG 24 hr capsule    Sig: Take 1 capsule (30 mg total) by mouth every morning.    Dispense:  30 capsule    Refill:  0    Fill for june   amphetamine-dextroamphetamine (ADDERALL XR) 30 MG 24 hr capsule    Sig: Take 1 capsule (30 mg total) by mouth every morning.    Dispense:  30 capsule    Refill:  0    Fill for july   hydrochlorothiazide (HYDRODIURIL) 12.5 MG tablet    Sig: Take 1 tablet (12.5 mg total) by mouth daily.    Dispense:  90 tablet    Refill:  3   lisinopril (ZESTRIL) 30 MG tablet     Sig: Take 1 tablet (30 mg total) by  mouth daily.    Dispense:  90 tablet    Refill:  3    **Patient requests 90 days supply**   meloxicam (MOBIC) 15 MG tablet    Sig: Take 1 tablet (15 mg total) by mouth daily.    Dispense:  30 tablet    Refill:  1    Return in about 3 months (around 06/05/2022) for F/U, ADHD med check, My[facility] PCP; need UDS next visit, reschedule july appt for late july/early aug.   Total time spent:30 Minutes Time spent includes review of chart, medications, test results, and follow up plan with the patient.   [place] Controlled Substance Database was reviewed by me.  This patient was seen by Jonetta Osgood, FNP-C in collaboration with Dr. Clayborn Bigness as a part of collaborative care agreement.   Rilynn Habel R. Valetta Fuller, MSN, FNP-C Internal medicine

## 2022-04-08 ENCOUNTER — Encounter: Payer: Self-pay | Admitting: Nurse Practitioner

## 2022-04-22 ENCOUNTER — Telehealth: Payer: Self-pay

## 2022-04-22 NOTE — Telephone Encounter (Signed)
Sent PA to plan. PA pending.

## 2022-04-23 ENCOUNTER — Telehealth: Payer: Self-pay

## 2022-04-23 NOTE — Telephone Encounter (Signed)
Dextroamp-amphet ER '30mg'$  caps approved from 04-23-22 to 04-23-23

## 2022-05-14 IMAGING — MG MM DIGITAL SCREENING BILAT W/ TOMO AND CAD
8 series · 8 of 24 positions shown · non-contrast
Comparison: Previous exam(s).

CLINICAL DATA: Screening.

EXAM:
DIGITAL SCREENING BILATERAL MAMMOGRAM WITH TOMOSYNTHESIS AND CAD
TECHNIQUE: Bilateral screening digital craniocaudal and mediolateral oblique
mammograms were obtained. Bilateral screening digital breast
tomosynthesis was performed. The images were evaluated with
computer-aided detection.

[R CC synth-2D]
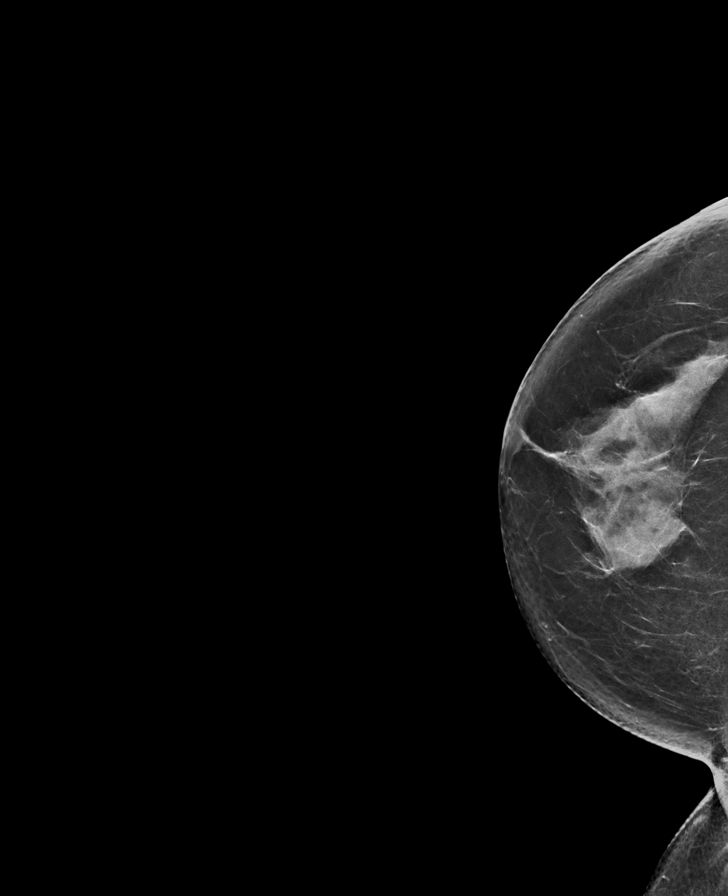

[R MLO synth-2D]
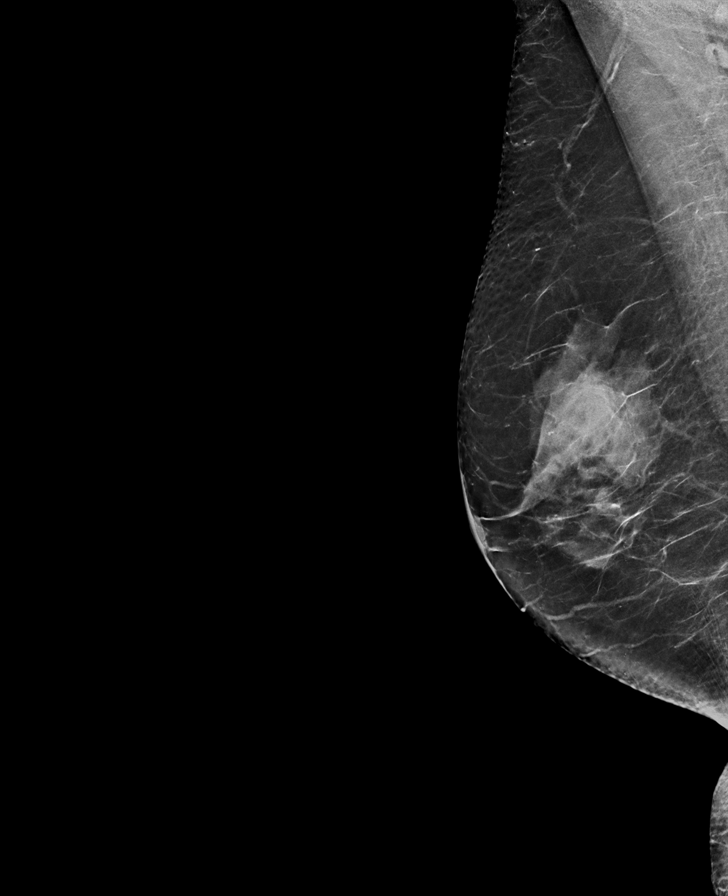

[L MLO synth-2D]
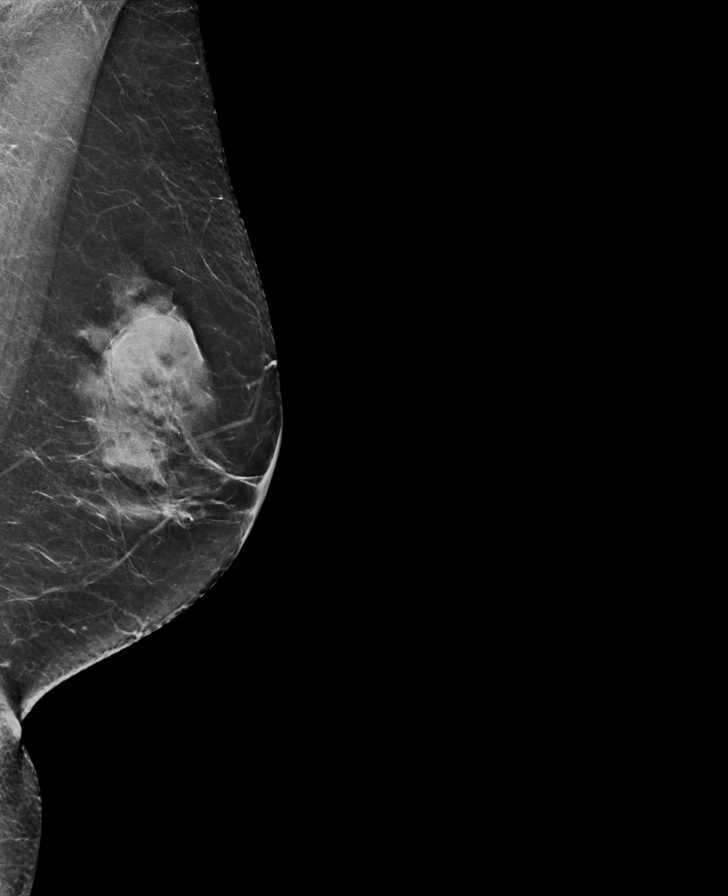

[L CC synth-2D]
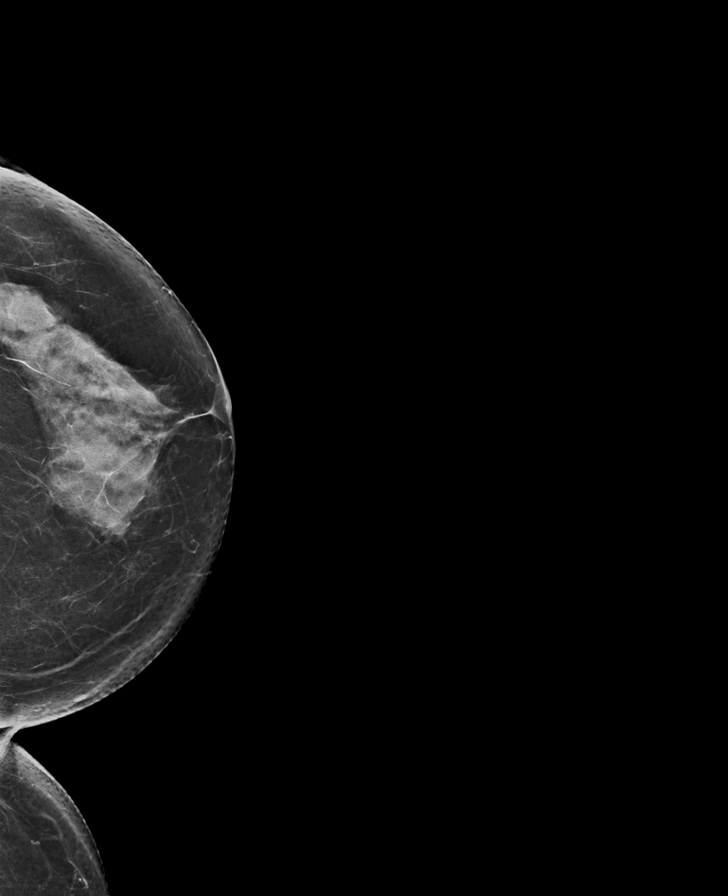

[L CC tomo · tomo slice 37/73.0]
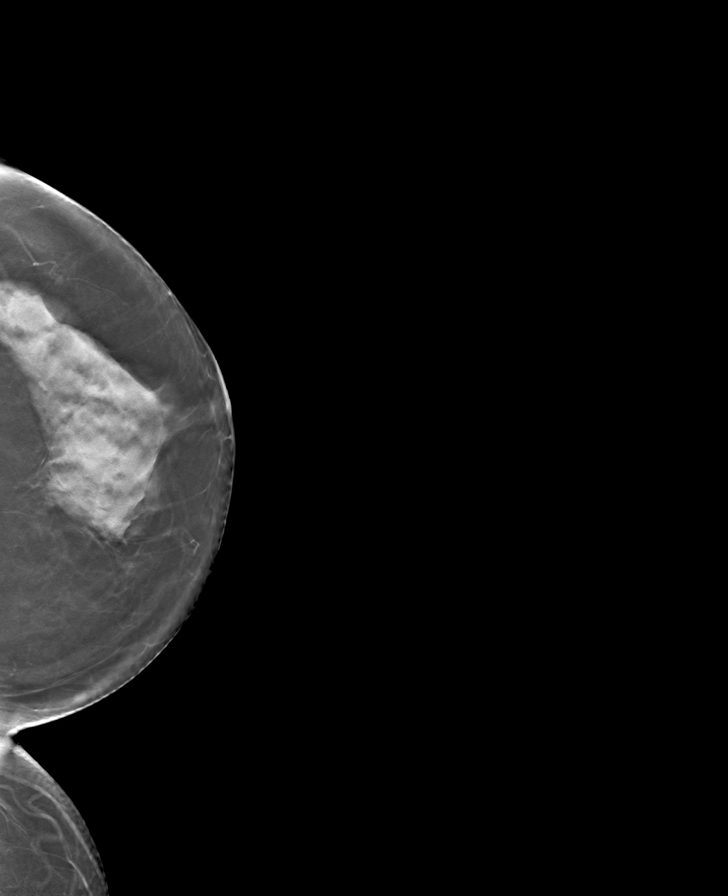

[R MLO tomo · tomo slice 36/71.0]
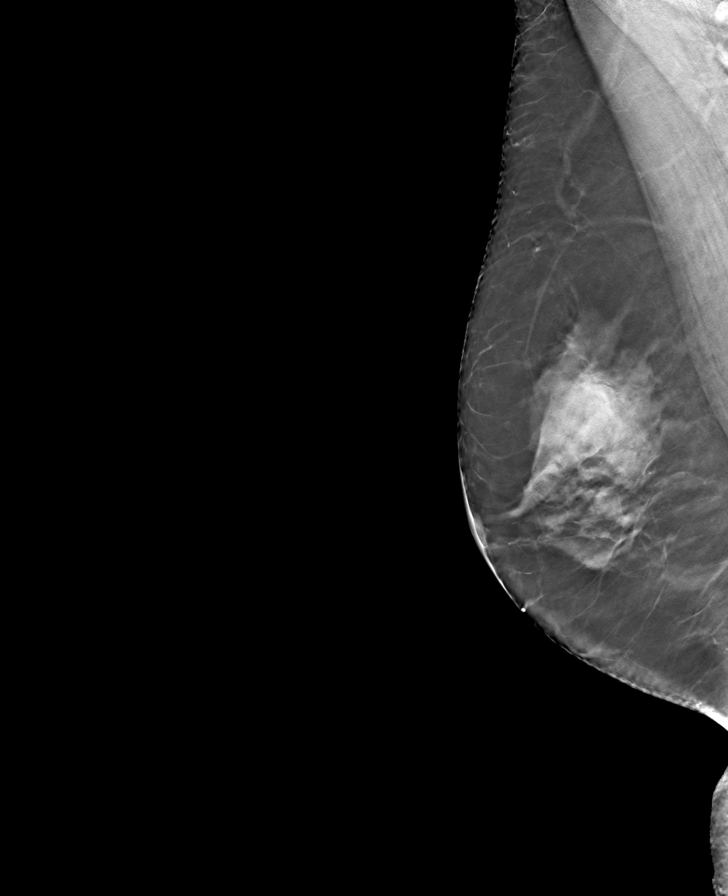

[R CC tomo · tomo slice 37/73.0]
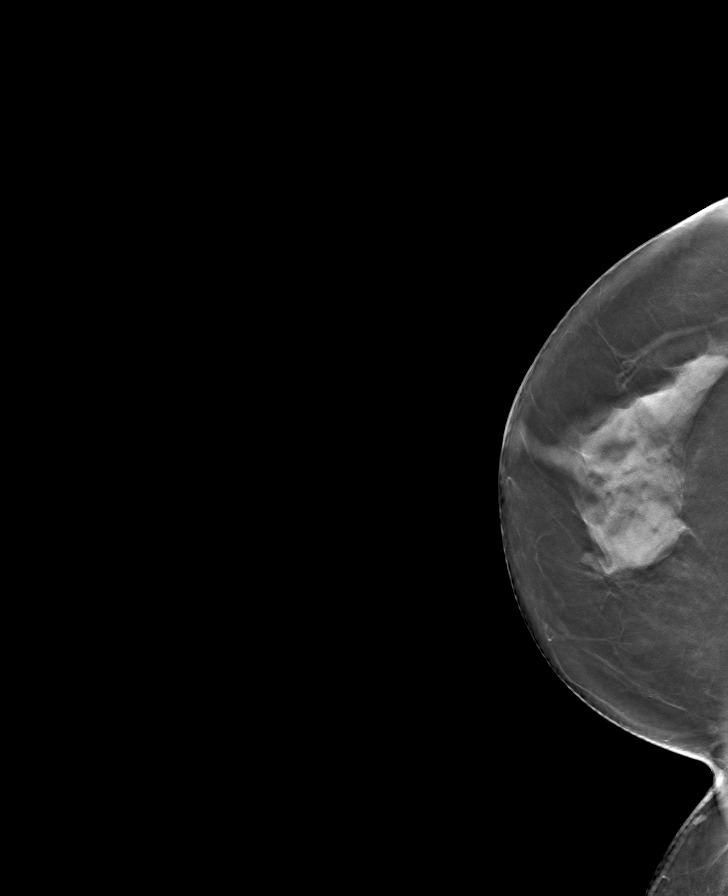

[L MLO tomo · tomo slice 35/70.0]
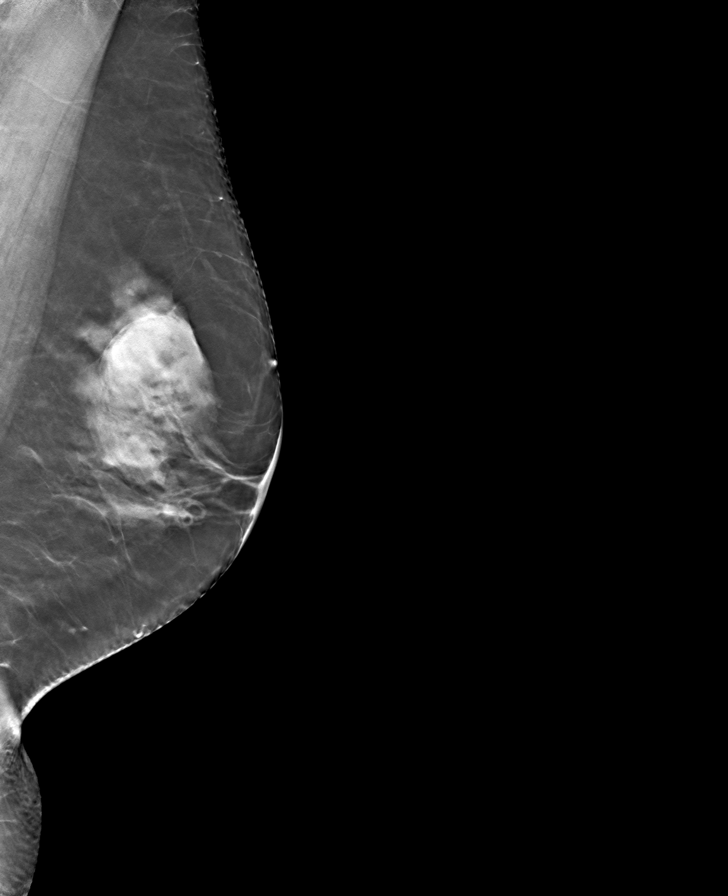

[8 of 24 positions shown; findings below may reference images not displayed]

ACR Breast Density Category c: The breast tissue is heterogeneously
dense, which may obscure small masses.
FINDINGS: There are no findings suspicious for malignancy.
IMPRESSION: No mammographic evidence of malignancy. A result letter of this
screening mammogram will be mailed directly to the patient.

RECOMMENDATION:
Screening mammogram in one year. (Code:Q3-W-BC3)

BI-RADS CATEGORY  1: Negative.

## 2022-05-20 ENCOUNTER — Ambulatory Visit: Payer: BC Managed Care – PPO | Admitting: Nurse Practitioner

## 2022-06-11 ENCOUNTER — Ambulatory Visit: Payer: BC Managed Care – PPO | Admitting: Nurse Practitioner

## 2022-06-11 ENCOUNTER — Encounter: Payer: Self-pay | Admitting: Nurse Practitioner

## 2022-06-11 VITALS — BP 133/80 | HR 62 | Temp 98.2°F | Resp 16 | Ht 66.5 in | Wt 175.8 lb

## 2022-06-11 DIAGNOSIS — F988 Other specified behavioral and emotional disorders with onset usually occurring in childhood and adolescence: Secondary | ICD-10-CM | POA: Diagnosis not present

## 2022-06-11 DIAGNOSIS — R635 Abnormal weight gain: Secondary | ICD-10-CM

## 2022-06-11 DIAGNOSIS — Z79899 Other long term (current) drug therapy: Secondary | ICD-10-CM | POA: Diagnosis not present

## 2022-06-11 DIAGNOSIS — I10 Essential (primary) hypertension: Secondary | ICD-10-CM | POA: Diagnosis not present

## 2022-06-11 LAB — POCT URINE DRUG SCREEN
Methylenedioxyamphetamine: NOT DETECTED
POC Amphetamine UR: NOT DETECTED
POC BENZODIAZEPINES UR: NOT DETECTED
POC Barbiturate UR: NOT DETECTED
POC Cocaine UR: NOT DETECTED
POC Ecstasy UR: NOT DETECTED
POC Marijuana UR: NOT DETECTED
POC Methadone UR: NOT DETECTED
POC Methamphetamine UR: NOT DETECTED
POC Opiate Ur: NOT DETECTED
POC Oxycodone UR: NOT DETECTED
POC PHENCYCLIDINE UR: NOT DETECTED
POC TRICYCLICS UR: NOT DETECTED

## 2022-06-11 MED ORDER — BUPROPION HCL ER (XL) 150 MG PO TB24: 150.0000 mg | ORAL_TABLET | Freq: Every day | ORAL | 2 refills | Status: DC

## 2022-06-11 MED ORDER — AMPHETAMINE-DEXTROAMPHET ER 30 MG PO CP24: 30.0000 mg | ORAL_CAPSULE | ORAL | 0 refills | Status: DC

## 2022-06-11 MED ORDER — AMPHETAMINE-DEXTROAMPHET ER 30 MG PO CP24
30.0000 mg | ORAL_CAPSULE | ORAL | 0 refills | Status: DC
Start: 2022-08-06 — End: 2022-07-23

## 2022-06-11 NOTE — Progress Notes (Signed)
[facility] [place], [place] [address]  Internal MEDICINE  Office Visit Note  Patient Name: April Wilkins  [id number]  [id number]  Date of Service: 06/11/2022  Chief Complaint  Patient presents with   Follow-up   Hypertension   ADHD   Medication Refill    HPI April Wilkins presents for follow-up visit for hypertension, ADHD and weight gain.  She takes her Adderall XR mainly for work Monday through Friday and usually does not take it over the weekends because it makes her jittery when she does not need it.  Her blood pressure is stable and within normal limits, her heart rate is within normal limits as well.  Other than feeling jittery she denies any palpitations or other adverse side effects of Adderall XR.  She is having difficulty with weight gain and reports that she has gained about 15 pounds since her previous office visit and wants some help with losing weight. She is continuing to take lisinopril and hydrochlorothiazide for her blood pressure and those medications are effective.    Current Medication: Outpatient Encounter Medications as of 06/11/2022  Medication Sig   buPROPion (WELLBUTRIN XL) 150 MG 24 hr tablet Take 1 tablet (150 mg total) by mouth daily.   hydrochlorothiazide (HYDRODIURIL) 12.5 MG tablet Take 1 tablet (12.5 mg total) by mouth daily.   lisinopril (ZESTRIL) 30 MG tablet Take 1 tablet (30 mg total) by mouth daily.   meloxicam (MOBIC) 15 MG tablet Take 1 tablet (15 mg total) by mouth daily.   valACYclovir (VALTREX) 1000 MG tablet Take 2 tablets at first sign of symptoms, repeat in 12 hours for 1 day dose   [DISCONTINUED] amphetamine-dextroamphetamine (ADDERALL XR) 30 MG 24 hr capsule Take 1 capsule (30 mg total) by mouth every morning.   [DISCONTINUED] amphetamine-dextroamphetamine (ADDERALL XR) 30 MG 24 hr capsule Take 1 capsule (30 mg total) by mouth every morning.   [DISCONTINUED] amphetamine-dextroamphetamine (ADDERALL XR) 30 MG 24 hr  capsule Take 1 capsule (30 mg total) by mouth every morning.   amphetamine-dextroamphetamine (ADDERALL XR) 30 MG 24 hr capsule Take 1 capsule (30 mg total) by mouth every morning.   [START ON 07/09/2022] amphetamine-dextroamphetamine (ADDERALL XR) 30 MG 24 hr capsule Take 1 capsule (30 mg total) by mouth every morning.   [START ON 08/06/2022] amphetamine-dextroamphetamine (ADDERALL XR) 30 MG 24 hr capsule Take 1 capsule (30 mg total) by mouth every morning.   No facility-administered encounter medications on file as of 06/11/2022.    Surgical History: Past Surgical History:  Procedure Laterality Date   SKIN BIOPSY      Medical History: Past Medical History:  Diagnosis Date   Actinic keratosis 08/31/2010   Right forearm. Pigmented.   Allergy    Seasonal   Basal cell carcinoma (BCC) of right upper arm 02/08/2010   Right deltoid. Superficial.    Chicken pox    Dysplastic nevus 02/08/2010   R mid back bra line. Moderate to severe atypia, close to margin. Excised 03/28/2010, margins free.   Dysplastic nevus 02/08/2010   Left mid to low back above MM site. Moderate atypia, close to margin   Dysplastic nevus 02/08/2010   Left sup. lat. buttocks near side. Moderate atypia, close to margin.   Dysplastic nevus 05/22/2015   Left lower lat. back. Moderat to severe atypia, close to margin. Excised 06/12/2015, margins free.   Dysplastic nevus 03/31/2018   Central upper abdomen. Moderate atypia, limited margins free.   Frequent headaches    Hypertension  Melanoma ([place]) 2010   left lower back   Migraines    Squamous cell carcinoma of skin    back   Squamous cell carcinoma of skin 12/20/2020   L upper arm, SCC IS, EDC 01/17/21    Family History: Family History  Problem Relation Age of Onset   Hyperlipidemia Father    Heart disease Father    Diabetes Father    Cancer Father        Skin Cancer   Alcohol abuse Paternal Uncle    Hyperlipidemia Paternal Uncle    Heart disease Paternal  Uncle    Kidney disease Maternal Grandfather    Hyperlipidemia Paternal Grandmother    Heart disease Paternal Grandmother    Drug abuse Paternal Grandmother    Breast cancer Maternal Grandmother 70    Social History   Socioeconomic History   Marital status: Single    Spouse name: Not on file   Number of children: Not on file   Years of education: Not on file   Highest education level: Not on file  Occupational History   Not on file  Tobacco Use   Smoking status: Never   Smokeless tobacco: Never  Substance and Sexual Activity   Alcohol use: Yes    Alcohol/week: 0.0 standard drinks of alcohol    Comment: Rare    Drug use: No   Sexual activity: Yes    Partners: Male  Other Topics Concern   Not on file  Social History Narrative   Divorced   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     Social Determinants of Health   Financial Resource Strain: Not on file  Food Insecurity: Not on file  Transportation Needs: Not on file  Physical Activity: Not on file  Stress: Not on file  Social Connections: Not on file  Intimate Partner Violence: Not on file      Review of Systems  Constitutional:  Positive for unexpected weight change. Negative for chills and fatigue.  HENT:  Negative for congestion, rhinorrhea, sneezing and sore throat.   Eyes:  Negative for redness.  Respiratory:  Negative for cough, chest tightness and shortness of breath.   Cardiovascular:  Negative for chest pain and palpitations.  Gastrointestinal:  Negative for abdominal pain, constipation, diarrhea, nausea and vomiting.  Genitourinary:  Negative for dysuria and frequency.  Musculoskeletal:  Negative for arthralgias, back pain, joint swelling and neck pain.  Skin:  Negative for rash.  Neurological: Negative.  Negative for tremors and numbness.  Hematological:  Negative for adenopathy. Does not bruise/bleed easily.   Psychiatric/Behavioral:  Negative for behavioral problems (Depression), sleep disturbance and suicidal ideas. The patient is not nervous/anxious.     Vital Signs: BP 133/80   Pulse 62   Temp 98.2 F (36.8 C)   Resp 16   Ht 5' 6.5" (1.689 m)   Wt 175 lb 12.8 oz (79.7 kg)   SpO2 98%   BMI 27.95 kg/m    Physical Exam Vitals reviewed.  Constitutional:      General: She is not in acute distress.    Appearance: Normal appearance. She is not ill-appearing.  HENT:     Head: Normocephalic and atraumatic.  Eyes:     Pupils: Pupils are equal, round, and reactive to light.  Cardiovascular:     Rate and Rhythm: Normal rate and regular rhythm.  Pulmonary:     Effort: Pulmonary effort is normal. No respiratory distress.  Neurological:     Mental Status: She is alert and oriented to person, place, and time.  Psychiatric:        Mood and Affect: Mood normal.        Behavior: Behavior normal.        Assessment/Plan: 1. Primary hypertension Stable and well-controlled with current medication.  2. Recent weight gain Patient gained approximately 15 pounds since her previous office visit, bupropion added to aid in weight loss will follow-up in 1 month - buPROPion (WELLBUTRIN XL) 150 MG 24 hr tablet; Take 1 tablet (150 mg total) by mouth daily.  Dispense: 30 tablet; Refill: 2  3. Encounter for long-term (current) use of high-risk medication Urine drug screen was negative but patient does not take her Adderall every day, she holds on the weekend and will sometimes hold it when she goes to work if she does not have any presentations or other significant meetings to attend. - POCT Urine Drug Screen  4. Attention deficit disorder (ADD) in adult Refills of Adderall XR sent to pharmacy x3 months, follow-up in 3 months for additional refills.  We will try bupropion to help control ADHD symptoms which may lessen her need for the Adderall to control symptoms.  Follow-up in 1 month to evaluate  effectiveness of bupropion - buPROPion (WELLBUTRIN XL) 150 MG 24 hr tablet; Take 1 tablet (150 mg total) by mouth daily.  Dispense: 30 tablet; Refill: 2 - amphetamine-dextroamphetamine (ADDERALL XR) 30 MG 24 hr capsule; Take 1 capsule (30 mg total) by mouth every morning.  Dispense: 30 capsule; Refill: 0 - amphetamine-dextroamphetamine (ADDERALL XR) 30 MG 24 hr capsule; Take 1 capsule (30 mg total) by mouth every morning.  Dispense: 30 capsule; Refill: 0 - amphetamine-dextroamphetamine (ADDERALL XR) 30 MG 24 hr capsule; Take 1 capsule (30 mg total) by mouth every morning.  Dispense: 30 capsule; Refill: 0   General Counseling: Rosaleigh verbalizes understanding of the findings of todays visit and agrees with plan of treatment. I have discussed any further diagnostic evaluation that may be needed or ordered today. We also reviewed her medications today. she has been encouraged to call the office with any questions or concerns that should arise related to todays visit.    Orders Placed This Encounter  Procedures   POCT Urine Drug Screen    Meds ordered this encounter  Medications   buPROPion (WELLBUTRIN XL) 150 MG 24 hr tablet    Sig: Take 1 tablet (150 mg total) by mouth daily.    Dispense:  30 tablet    Refill:  2   amphetamine-dextroamphetamine (ADDERALL XR) 30 MG 24 hr capsule    Sig: Take 1 capsule (30 mg total) by mouth every morning.    Dispense:  30 capsule    Refill:  0    Fill for 06/11/22   amphetamine-dextroamphetamine (ADDERALL XR) 30 MG 24 hr capsule    Sig: Take 1 capsule (30 mg total) by mouth every morning.    Dispense:  30 capsule    Refill:  0    Fill for 07/09/22   amphetamine-dextroamphetamine (ADDERALL XR) 30 MG 24 hr capsule    Sig: Take 1 capsule (30 mg total) by mouth every morning.    Dispense:  30 capsule    Refill:  0    Fill for 08/06/22    Return in about 1 month (around 07/12/2022) for F/U, eval new med, [place] PCP.  Total time spent:30 Minutes Time spent  includes review of chart, medications, test results, and follow up plan with the patient.   [place] Controlled Substance Database was reviewed by me.  This patient was seen by Jonetta Osgood, FNP-C in collaboration with Dr. Clayborn Bigness as a part of collaborative care agreement.   Larence Thone R. Valetta Fuller, MSN, FNP-C Internal medicine

## 2022-06-14 ENCOUNTER — Other Ambulatory Visit: Payer: Self-pay | Admitting: Nurse Practitioner

## 2022-06-14 DIAGNOSIS — I10 Essential (primary) hypertension: Secondary | ICD-10-CM

## 2022-07-04 ENCOUNTER — Encounter: Payer: Self-pay | Admitting: Nurse Practitioner

## 2022-07-08 ENCOUNTER — Ambulatory Visit: Payer: BC Managed Care – PPO | Admitting: Dermatology

## 2022-07-08 DIAGNOSIS — L578 Other skin changes due to chronic exposure to nonionizing radiation: Secondary | ICD-10-CM

## 2022-07-08 DIAGNOSIS — Z85828 Personal history of other malignant neoplasm of skin: Secondary | ICD-10-CM

## 2022-07-08 DIAGNOSIS — L821 Other seborrheic keratosis: Secondary | ICD-10-CM

## 2022-07-08 DIAGNOSIS — Z872 Personal history of diseases of the skin and subcutaneous tissue: Secondary | ICD-10-CM

## 2022-07-08 NOTE — Progress Notes (Signed)
   Follow-Up Visit   Subjective  April Wilkins is a 61 y.o. female who presents for the following: Actinic Keratosis (L mid back x1, R upper arm x 3, L upper vermilion lip x 1, 76mf/u).   The following portions of the chart were reviewed this encounter and updated as appropriate:       Review of Systems:  No other skin or systemic complaints except as noted in HPI or Assessment and Plan.  Objective  Well appearing patient in no apparent distress; mood and affect are within normal limits.  A focused examination was performed including back, arms, face. Relevant physical exam findings are noted in the Assessment and Plan.  L upper lip vermillion, R upper arm, L mid back clear    Assessment & Plan   Seborrheic Keratoses - Stuck-on, waxy, tan-brown papules and/or plaques  - Benign-appearing - Discussed benign etiology and prognosis. - Observe - Call for any changes - back, R arm  Actinic Damage - chronic, secondary to cumulative UV radiation exposure/sun exposure over time - diffuse scaly erythematous macules with underlying dyspigmentation - Recommend daily broad spectrum sunscreen SPF 30+ to sun-exposed areas, reapply every 2 hours as needed.  - Recommend staying in the shade or wearing long sleeves, sun glasses (UVA+UVB protection) and wide brim hats (4-inch brim around the entire circumference of the hat). - Call for new or changing lesions.   History of actinic keratoses L upper lip vermillion, R upper arm, L mid back  Clear. Observe for recurrence. Call clinic for new or changing lesions.  Recommend regular skin exams, daily broad-spectrum spf 30+ sunscreen use, and photoprotection.      History of Skin Cancer   Melanoma, SCC, BCC. Observe for recurrence.  Call clinic for new or changing lesions.   Recommend regular skin exams, daily broad-spectrum spf 30+ sunscreen use, and photoprotection.      Return for as scheduled for TBSE , Hx of Melanoma, Hx of BCC,  Hx of SCC, Hx of Dysplastic nevi, Hx of AKs.  I, SOthelia Pulling RMA, am acting as scribe for TBrendolyn Patty MD .  Documentation: I have reviewed the above documentation for accuracy and completeness, and I agree with the above.  TBrendolyn PattyMD

## 2022-07-08 NOTE — Patient Instructions (Signed)
Due to recent changes in healthcare laws, you may see results of your pathology and/or laboratory studies on MyChart before the doctors have had a chance to review them. We understand that in some cases there may be results that are confusing or concerning to you. Please understand that not all results are received at the same time and often the doctors may need to interpret multiple results in order to provide you with the best plan of care or course of treatment. Therefore, we ask that you please give us 2 business days to thoroughly review all your results before contacting the office for clarification. Should we see a critical lab result, you will be contacted sooner.   If You Need Anything After Your Visit  If you have any questions or concerns for your doctor, please call our main line at [phone] and press option 4 to reach your doctor's medical assistant. If no one answers, please leave a voicemail as directed and we will return your call as soon as possible. Messages left after 4 pm will be answered the following business day.   You may also send us a message via MyChart. We typically respond to MyChart messages within 1-2 business days.  For prescription refills, please ask your pharmacy to contact our office. Our fax number is [phone].  If you have an urgent issue when the clinic is closed that cannot wait until the next business day, you can page your doctor at the number below.    Please note that while we do our best to be available for urgent issues outside of office hours, we are not available 24/7.   If you have an urgent issue and are unable to reach us, you may choose to seek medical care at your doctor's office, retail clinic, urgent care center, or emergency room.  If you have a medical emergency, please immediately call 911 or go to the emergency department.  Pager Numbers  - Dr. Kowalski: [phone]  - Dr. Moye: [phone]  - Dr. Stewart:  [phone]  In the event of inclement weather, please call our main line at [phone] for an update on the status of any delays or closures.  Dermatology Medication Tips: Please keep the boxes that topical medications come in in order to help keep track of the instructions about where and how to use these. Pharmacies typically print the medication instructions only on the boxes and not directly on the medication tubes.   If your medication is too expensive, please contact our office at [phone] option 4 or send us a message through MyChart.   We are unable to tell what your co-pay for medications will be in advance as this is different depending on your insurance coverage. However, we may be able to find a substitute medication at lower cost or fill out paperwork to get insurance to cover a needed medication.   If a prior authorization is required to get your medication covered by your insurance company, please allow us 1-2 business days to complete this process.  Drug prices often vary depending on where the prescription is filled and some pharmacies may offer cheaper prices.  The website www.goodrx.com contains coupons for medications through different pharmacies. The prices here do not account for what the cost may be with help from insurance (it may be cheaper with your insurance), but the website can give you the price if you did not use any insurance.  - You can print the associated coupon and take it with   your prescription to the pharmacy.  - You may also stop by our office during regular business hours and pick up a GoodRx coupon card.  - If you need your prescription sent electronically to a different pharmacy, notify our office through [facility] MyChart or by phone at [phone] option 4.     Si Usted Necesita Algo Despus de Su Visita  Tambin puede enviarnos un mensaje a travs de MyChart. Por lo general respondemos a los mensajes de MyChart en el transcurso de 1 a 2  das hbiles.  Para renovar recetas, por favor pida a su farmacia que se ponga en contacto con nuestra oficina. Nuestro nmero de fax es el [phone].  Si tiene un asunto urgente cuando la clnica est cerrada y que no puede esperar hasta el siguiente da hbil, puede llamar/localizar a su doctor(a) al nmero que aparece a continuacin.   Por favor, tenga en cuenta que aunque hacemos todo lo posible para estar disponibles para asuntos urgentes fuera del horario de oficina, no estamos disponibles las 24 horas del da, los 7 das de la semana.   Si tiene un problema urgente y no puede comunicarse con nosotros, puede optar por buscar atencin mdica  en el consultorio de su doctor(a), en una clnica privada, en un centro de atencin urgente o en una sala de emergencias.  Si tiene una emergencia mdica, por favor llame inmediatamente al 911 o vaya a la sala de emergencias.  Nmeros de bper  - Dr. Kowalski: [phone]  - Dra. Moye: [phone]  - Dra. Stewart: [phone]  En caso de inclemencias del tiempo, por favor llame a nuestra lnea principal al [phone] para una actualizacin sobre el estado de cualquier retraso o cierre.  Consejos para la medicacin en dermatologa: Por favor, guarde las cajas en las que vienen los medicamentos de uso tpico para ayudarle a seguir las instrucciones sobre dnde y cmo usarlos. Las farmacias generalmente imprimen las instrucciones del medicamento slo en las cajas y no directamente en los tubos del medicamento.   Si su medicamento es muy caro, por favor, pngase en contacto con nuestra oficina llamando al [phone] y presione la opcin 4 o envenos un mensaje a travs de MyChart.   No podemos decirle cul ser su copago por los medicamentos por adelantado ya que esto es diferente dependiendo de la cobertura de su seguro. Sin embargo, es posible que podamos encontrar un medicamento sustituto a menor costo o llenar un formulario para que el  seguro cubra el medicamento que se considera necesario.   Si se requiere una autorizacin previa para que su compaa de seguros cubra su medicamento, por favor permtanos de 1 a 2 das hbiles para completar este proceso.  Los precios de los medicamentos varan con frecuencia dependiendo del lugar de dnde se surte la receta y alguna farmacias pueden ofrecer precios ms baratos.  El sitio web www.goodrx.com tiene cupones para medicamentos de diferentes farmacias. Los precios aqu no tienen en cuenta lo que podra costar con la ayuda del seguro (puede ser ms barato con su seguro), pero el sitio web puede darle el precio si no utiliz ningn seguro.  - Puede imprimir el cupn correspondiente y llevarlo con su receta a la farmacia.  - Tambin puede pasar por nuestra oficina durante el horario de atencin regular y recoger una tarjeta de cupones de GoodRx.  - Si necesita que su receta se enve electrnicamente a una farmacia diferente, informe a nuestra oficina a travs de MyChart de [place]   o por telfono llamando al [phone] y presione la opcin 4.  

## 2022-07-15 NOTE — Telephone Encounter (Signed)
Please call patient and have April Wilkins scheduled for a weight loss management consult with [facility]. She can cancel April Wilkins appointment for this week but I would like for April Wilkins to talk to Dr. Humphrey Rolls. She is not diabetic and is already on ADHD meds so that decreases the options even further.

## 2022-07-16 ENCOUNTER — Ambulatory Visit: Payer: BC Managed Care – PPO | Admitting: Nurse Practitioner

## 2022-07-23 ENCOUNTER — Encounter: Payer: Self-pay | Admitting: Internal Medicine

## 2022-07-23 ENCOUNTER — Ambulatory Visit: Payer: BC Managed Care – PPO | Admitting: Internal Medicine

## 2022-07-23 VITALS — BP 120/72 | HR 70 | Temp 98.2°F | Resp 16 | Ht 66.5 in | Wt 176.0 lb

## 2022-07-23 DIAGNOSIS — R0989 Other specified symptoms and signs involving the circulatory and respiratory systems: Secondary | ICD-10-CM | POA: Diagnosis not present

## 2022-07-23 DIAGNOSIS — F5081 Binge eating disorder: Secondary | ICD-10-CM

## 2022-07-23 DIAGNOSIS — F4522 Body dysmorphic disorder: Secondary | ICD-10-CM

## 2022-07-23 DIAGNOSIS — I517 Cardiomegaly: Secondary | ICD-10-CM | POA: Diagnosis not present

## 2022-07-23 DIAGNOSIS — I10 Essential (primary) hypertension: Secondary | ICD-10-CM

## 2022-07-23 DIAGNOSIS — Z6828 Body mass index (BMI) 28.0-28.9, adult: Secondary | ICD-10-CM

## 2022-07-23 MED ORDER — LISDEXAMFETAMINE DIMESYLATE 30 MG PO CAPS: 30.0000 mg | ORAL_CAPSULE | Freq: Every day | ORAL | 0 refills | Status: DC

## 2022-07-23 MED ORDER — SERTRALINE HCL 25 MG PO TABS: 25.0000 mg | ORAL_TABLET | Freq: Every day | ORAL | 3 refills | Status: DC

## 2022-07-23 NOTE — Progress Notes (Signed)
[facility] [place], [place] [address]  Internal MEDICINE  Office Visit Note  Patient Name: April Wilkins  [id number]  [id number]  Date of Service: 07/23/2022  Chief Complaint  Patient presents with   Follow-up   Hypertension   Weight Loss    Patient feels like she is at a plateau, not losing last bit of weight - Patient works from home, sits during the day    HPI Pt is here for medical consult of obesity  She has been working from home and feels like has been eating a little bit more however patient is overweight and not obese. Her minimal weight about 4- 5 years ago is 160 pounds, current weight is 176 pounds with a BMI of 28. She is also on Adderall for attention deficit disorder.,  Was given Wellbutrin for anxiety Does snack towards the evening Lives with her husband, has done weight watchers in the past with good success Patient is not active at all, home gym is present patient also has Apple Watch  Current Medication: Outpatient Encounter Medications as of 07/23/2022  Medication Sig   hydrochlorothiazide (HYDRODIURIL) 12.5 MG tablet Take 1 tablet (12.5 mg total) by mouth daily.   lisdexamfetamine (VYVANSE) 30 MG capsule Take 1 capsule (30 mg total) by mouth daily.   lisinopril (ZESTRIL) 30 MG tablet TAKE 1 TABLET(30 MG) BY MOUTH DAILY   meloxicam (MOBIC) 15 MG tablet Take 1 tablet (15 mg total) by mouth daily.   sertraline (ZOLOFT) 25 MG tablet Take 1 tablet (25 mg total) by mouth daily.   valACYclovir (VALTREX) 1000 MG tablet Take 2 tablets at first sign of symptoms, repeat in 12 hours for 1 day dose   [DISCONTINUED] amphetamine-dextroamphetamine (ADDERALL XR) 30 MG 24 hr capsule Take 1 capsule (30 mg total) by mouth every morning.   [DISCONTINUED] amphetamine-dextroamphetamine (ADDERALL XR) 30 MG 24 hr capsule Take 1 capsule (30 mg total) by mouth every morning.   [DISCONTINUED] amphetamine-dextroamphetamine (ADDERALL XR) 30 MG 24 hr capsule  Take 1 capsule (30 mg total) by mouth every morning.   [DISCONTINUED] buPROPion (WELLBUTRIN XL) 150 MG 24 hr tablet Take 1 tablet (150 mg total) by mouth daily.   No facility-administered encounter medications on file as of 07/23/2022.    Surgical History: Past Surgical History:  Procedure Laterality Date   SKIN BIOPSY      Medical History: Past Medical History:  Diagnosis Date   Actinic keratosis 08/31/2010   Right forearm. Pigmented.   Allergy    Seasonal   Basal cell carcinoma (BCC) of right upper arm 02/08/2010   Right deltoid. Superficial.    Chicken pox    Dysplastic nevus 02/08/2010   R mid back bra line. Moderate to severe atypia, close to margin. Excised 03/28/2010, margins free.   Dysplastic nevus 02/08/2010   Left mid to low back above MM site. Moderate atypia, close to margin   Dysplastic nevus 02/08/2010   Left sup. lat. buttocks near side. Moderate atypia, close to margin.   Dysplastic nevus 05/22/2015   Left lower lat. back. Moderat to severe atypia, close to margin. Excised 06/12/2015, margins free.   Dysplastic nevus 03/31/2018   Central upper abdomen. Moderate atypia, limited margins free.   Frequent headaches    Hypertension    Melanoma ([place]) 2010   left lower back   Migraines    Squamous cell carcinoma of skin    back   Squamous cell carcinoma of skin 12/20/2020   L  upper arm, SCC IS, [facility] 01/17/21    Family History: Family History  Problem Relation Age of Onset   Hyperlipidemia Father    Heart disease Father    Diabetes Father    Cancer Father        Skin Cancer   Alcohol abuse Paternal Uncle    Hyperlipidemia Paternal Uncle    Heart disease Paternal Uncle    Kidney disease Maternal Grandfather    Hyperlipidemia Paternal Grandmother    Heart disease Paternal Grandmother    Drug abuse Paternal Grandmother    Breast cancer Maternal Grandmother 70    Social History   Socioeconomic History   Marital status: Single    Spouse name: Not on  file   Number of children: Not on file   Years of education: Not on file   Highest education level: Not on file  Occupational History   Not on file  Tobacco Use   Smoking status: Never   Smokeless tobacco: Never  Substance and Sexual Activity   Alcohol use: Yes    Alcohol/week: 0.0 standard drinks of alcohol    Comment: Rare    Drug use: No   Sexual activity: Yes    Partners: Male  Other Topics Concern   Not on file  Social History Narrative   Divorced   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     Social Determinants of Health   Financial Resource Strain: Not on file  Food Insecurity: Not on file  Transportation Needs: Not on file  Physical Activity: Not on file  Stress: Not on file  Social Connections: Not on file  Intimate Partner Violence: Not on file      Review of Systems  Constitutional:  Negative for chills, fatigue and unexpected weight change.  HENT:  Positive for postnasal drip. Negative for congestion, rhinorrhea, sneezing and sore throat.   Eyes:  Negative for redness.  Respiratory:  Negative for cough, chest tightness and shortness of breath.   Cardiovascular:  Negative for chest pain and palpitations.  Gastrointestinal:  Negative for abdominal pain, constipation, diarrhea, nausea and vomiting.  Genitourinary:  Negative for dysuria and frequency.  Musculoskeletal:  Negative for arthralgias, back pain, joint swelling and neck pain.  Skin:  Negative for rash.  Neurological: Negative.  Negative for tremors and numbness.  Hematological:  Negative for adenopathy. Does not bruise/bleed easily.  Psychiatric/Behavioral:  Negative for behavioral problems (Depression), sleep disturbance and suicidal ideas. The patient is not nervous/anxious.     Vital Signs: BP 120/72   Pulse 70   Temp 98.2 F (36.8 C)   Resp 16   Ht 5' 6.5" (1.689 m)   Wt 176 lb (79.8 kg)   SpO2 100%    BMI 27.98 kg/m    Physical Exam Constitutional:      Appearance: Normal appearance.  HENT:     Head: Normocephalic and atraumatic.     Nose: Nose normal.     Mouth/Throat:     Mouth: Mucous membranes are moist.     Pharynx: No posterior oropharyngeal erythema.  Eyes:     Extraocular Movements: Extraocular movements intact.     Pupils: Pupils are equal, round, and reactive to light.  Cardiovascular:     Pulses: Normal pulses.     Heart sounds: Normal heart sounds.  Pulmonary:     Effort: Pulmonary effort  is normal.     Breath sounds: Normal breath sounds.  Neurological:     General: No focal deficit present.     Mental Status: She is alert.  Psychiatric:        Mood and Affect: Mood normal.        Behavior: Behavior normal.        Assessment/Plan: 1. Primary hypertension Blood pressure is well controlled we will continue hydrochlorothiazide as before  2. BMI 28.0-28.9,adult Discussion about her eating habits, food prep, portion control and exploring the different ideas about her activity Patient is instructed to count all daily activity with a goal of sit less and move more She will also get weight watchers at least for 2 meals between 600-800 15 to 20 g of protein in it For evening meal she will cook at home  3. Right atrial enlargement Patient had echo done, had a fairly of right atrial enlargement which was mild we will continue to monitor  4. Right carotid bruit Patient also has right carotid bruit she is asymptomatic at the moment we will monitor  5. Body image disorder Her BMI is 28 mild obesity however patient lacks motivation her eating habits are not out of control so patient will get benefit on low-dose Zoloft - sertraline (ZOLOFT) 25 MG tablet; Take 1 tablet (25 mg total) by mouth daily.  Dispense: 30 tablet; Refill: 3  6. Binge eating disorder Patient was instructed to be mindful of her eating habits and stop Adderall and start Vyvanse -  lisdexamfetamine (VYVANSE) 30 MG capsule; Take 1 capsule (30 mg total) by mouth daily.  Dispense: 30 capsule; Refill: 0  General Counseling: April Wilkins verbalizes understanding of the findings of todays visit and agrees with plan of treatment. I have discussed any further diagnostic evaluation that may be needed or ordered today. We also reviewed her medications today. she has been encouraged to call the office with any questions or concerns that should arise related to todays visit.   Meds ordered this encounter  Medications   lisdexamfetamine (VYVANSE) 30 MG capsule    Sig: Take 1 capsule (30 mg total) by mouth daily.    Dispense:  30 capsule    Refill:  0   sertraline (ZOLOFT) 25 MG tablet    Sig: Take 1 tablet (25 mg total) by mouth daily.    Dispense:  30 tablet    Refill:  3    Total time spent:35 Minutes Time spent includes review of chart, medications, test results, and follow up plan with the patient.   [place] Controlled Substance Database was reviewed by me.   Dr Lavera Guise Internal medicine

## 2022-07-24 MED ORDER — LISDEXAMFETAMINE DIMESYLATE 30 MG PO CAPS: 30.0000 mg | ORAL_CAPSULE | Freq: Every day | ORAL | 0 refills | Status: DC

## 2022-07-24 NOTE — Addendum Note (Signed)
Addended by: Lavera Guise on: 07/24/2022 09:11 AM   Modules accepted: Orders

## 2022-07-28 ENCOUNTER — Telehealth: Payer: Self-pay

## 2022-07-28 NOTE — Telephone Encounter (Signed)
PA sent for VYVANCE 30 mg and came back advising that PA cannot be processed electronically and for [place] to contact the number on back of prescription card

## 2022-08-08 ENCOUNTER — Telehealth: Payer: Self-pay

## 2022-08-08 NOTE — Telephone Encounter (Signed)
Pt requested to cancel appt on 08/20/22 due to her VYVANCE rx needed a PA.  I informed pt that I was working on the PA and will cancel appt and she can call back and reschedule once she is able to get prescription.

## 2022-08-13 ENCOUNTER — Telehealth: Payer: Self-pay

## 2022-08-13 NOTE — Telephone Encounter (Signed)
Called [employer] for PA on [place]  and they advised to go online to [employer] or [email or web address]

## 2022-08-14 ENCOUNTER — Telehealth: Payer: Self-pay

## 2022-08-14 NOTE — Telephone Encounter (Signed)
Completed form for PA for Vyvanse and ready for providers (Dr Humphrey Rolls) signature.

## 2022-08-16 ENCOUNTER — Telehealth: Payer: Self-pay

## 2022-08-16 NOTE — Telephone Encounter (Signed)
PA was faxed by [facility] 08/15/22

## 2022-08-20 ENCOUNTER — Ambulatory Visit: Payer: BC Managed Care – PPO | Admitting: Internal Medicine

## 2022-09-12 ENCOUNTER — Ambulatory Visit: Payer: BC Managed Care – PPO | Admitting: Nurse Practitioner

## 2022-09-12 ENCOUNTER — Encounter: Payer: Self-pay | Admitting: Nurse Practitioner

## 2022-09-12 VITALS — BP 125/75 | HR 84 | Temp 96.1°F | Resp 16 | Ht 66.5 in | Wt 170.2 lb

## 2022-09-12 DIAGNOSIS — F988 Other specified behavioral and emotional disorders with onset usually occurring in childhood and adolescence: Secondary | ICD-10-CM | POA: Diagnosis not present

## 2022-09-12 DIAGNOSIS — I10 Essential (primary) hypertension: Secondary | ICD-10-CM | POA: Diagnosis not present

## 2022-09-12 DIAGNOSIS — F4522 Body dysmorphic disorder: Secondary | ICD-10-CM

## 2022-09-12 MED ORDER — AMPHETAMINE-DEXTROAMPHET ER 30 MG PO CP24
30.0000 mg | ORAL_CAPSULE | Freq: Every day | ORAL | 0 refills | Status: DC
Start: 2022-09-12 — End: 2022-10-23

## 2022-09-12 MED ORDER — SERTRALINE HCL 25 MG PO TABS: 25.0000 mg | ORAL_TABLET | Freq: Every day | ORAL | 1 refills | Status: DC

## 2022-09-12 MED ORDER — AMPHETAMINE-DEXTROAMPHET ER 30 MG PO CP24: 30.0000 mg | ORAL_CAPSULE | Freq: Every day | ORAL | 0 refills | Status: DC

## 2022-09-12 NOTE — Progress Notes (Signed)
[facility] [place], [place] [address]  Internal MEDICINE  Office Visit Note  Patient Name: April Wilkins  [id number]  [id number]  Date of Service: 09/12/2022  Chief Complaint  Patient presents with   Follow-up    Med refills   Hypertension    HPI April Wilkins presents for a follow up visit for medication refills, hypertension, ADHD, and weight gain.  Was never able to get Vyvanse.  --wants to go back to her current adderall dose. No issues, denies palpitations or any other adverse side effects in the past from adderall except feels a little jittery if she take it on the weekends when she does not need it.  Blood pressure and heart rate are normal.  Lost 6 lbs since her previous office visit. Current BP meds are effective    Current Medication: Outpatient Encounter Medications as of 09/12/2022  Medication Sig   hydrochlorothiazide (HYDRODIURIL) 12.5 MG tablet Take 1 tablet (12.5 mg total) by mouth daily.   lisinopril (ZESTRIL) 30 MG tablet TAKE 1 TABLET(30 MG) BY MOUTH DAILY   meloxicam (MOBIC) 15 MG tablet Take 1 tablet (15 mg total) by mouth daily.   valACYclovir (VALTREX) 1000 MG tablet Take 2 tablets at first sign of symptoms, repeat in 12 hours for 1 day dose   [DISCONTINUED] amphetamine-dextroamphetamine (ADDERALL XR) 30 MG 24 hr capsule Take 30 mg by mouth daily.   [DISCONTINUED] lisdexamfetamine (VYVANSE) 30 MG capsule Take 1 capsule (30 mg total) by mouth daily.   [DISCONTINUED] sertraline (ZOLOFT) 25 MG tablet Take 1 tablet (25 mg total) by mouth daily.   amphetamine-dextroamphetamine (ADDERALL XR) 30 MG 24 hr capsule Take 1 capsule (30 mg total) by mouth daily.   [START ON 10/10/2022] amphetamine-dextroamphetamine (ADDERALL XR) 30 MG 24 hr capsule Take 1 capsule (30 mg total) by mouth daily.   [START ON 11/07/2022] amphetamine-dextroamphetamine (ADDERALL XR) 30 MG 24 hr capsule Take 1 capsule (30 mg total) by mouth daily.   sertraline (ZOLOFT)  25 MG tablet Take 1 tablet (25 mg total) by mouth daily.   No facility-administered encounter medications on file as of 09/12/2022.    Surgical History: Past Surgical History:  Procedure Laterality Date   SKIN BIOPSY      Medical History: Past Medical History:  Diagnosis Date   Actinic keratosis 08/31/2010   Right forearm. Pigmented.   Allergy    Seasonal   Basal cell carcinoma (BCC) of right upper arm 02/08/2010   Right deltoid. Superficial.    Chicken pox    Dysplastic nevus 02/08/2010   R mid back bra line. Moderate to severe atypia, close to margin. Excised 03/28/2010, margins free.   Dysplastic nevus 02/08/2010   Left mid to low back above MM site. Moderate atypia, close to margin   Dysplastic nevus 02/08/2010   Left sup. lat. buttocks near side. Moderate atypia, close to margin.   Dysplastic nevus 05/22/2015   Left lower lat. back. Moderat to severe atypia, close to margin. Excised 06/12/2015, margins free.   Dysplastic nevus 03/31/2018   Central upper abdomen. Moderate atypia, limited margins free.   Frequent headaches    Hypertension    Melanoma ([place]) 2010   left lower back   Migraines    Squamous cell carcinoma of skin    back   Squamous cell carcinoma of skin 12/20/2020   L upper arm, SCC IS, EDC 01/17/21    Family History: Family History  Problem Relation Age of Onset   Hyperlipidemia Father  Heart disease Father    Diabetes Father    Cancer Father        Skin Cancer   Alcohol abuse Paternal Uncle    Hyperlipidemia Paternal Uncle    Heart disease Paternal Uncle    Kidney disease Maternal Grandfather    Hyperlipidemia Paternal Grandmother    Heart disease Paternal Grandmother    Drug abuse Paternal Grandmother    Breast cancer Maternal Grandmother 70    Social History   Socioeconomic History   Marital status: Single    Spouse name: Not on file   Number of children: Not on file   Years of education: Not on file   Highest education level: Not  on file  Occupational History   Not on file  Tobacco Use   Smoking status: Never   Smokeless tobacco: Never  Substance and Sexual Activity   Alcohol use: Yes    Alcohol/week: 0.0 standard drinks of alcohol    Comment: Rare    Drug use: No   Sexual activity: Yes    Partners: Male  Other Topics Concern   Not on file  Social History Narrative   Divorced   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     Social Determinants of Health   Financial Resource Strain: Not on file  Food Insecurity: Not on file  Transportation Needs: Not on file  Physical Activity: Not on file  Stress: Not on file  Social Connections: Not on file  Intimate Partner Violence: Not on file      Review of Systems  Constitutional:  Negative for chills, fatigue and unexpected weight change.  HENT:  Positive for postnasal drip. Negative for congestion, rhinorrhea, sneezing and sore throat.   Eyes:  Negative for redness.  Respiratory:  Negative for cough, chest tightness and shortness of breath.   Cardiovascular:  Negative for chest pain and palpitations.  Gastrointestinal:  Negative for abdominal pain, constipation, diarrhea, nausea and vomiting.  Genitourinary:  Negative for dysuria and frequency.  Musculoskeletal:  Negative for arthralgias, back pain, joint swelling and neck pain.  Skin:  Negative for rash.  Neurological: Negative.  Negative for tremors and numbness.  Hematological:  Negative for adenopathy. Does not bruise/bleed easily.  Psychiatric/Behavioral:  Negative for behavioral problems (Depression), sleep disturbance and suicidal ideas. The patient is not nervous/anxious.     Vital Signs: BP 125/75   Pulse 84   Temp (!) 96.1 F (35.6 C)   Resp 16   Ht 5' 6.5" (1.689 m)   Wt 170 lb 3.2 oz (77.2 kg)   SpO2 96%   BMI 27.06 kg/m    Physical Exam Constitutional:      Appearance: Normal appearance.  HENT:      Head: Normocephalic and atraumatic.     Nose: Nose normal.     Mouth/Throat:     Mouth: Mucous membranes are moist.     Pharynx: No posterior oropharyngeal erythema.  Eyes:     Extraocular Movements: Extraocular movements intact.     Pupils: Pupils are equal, round, and reactive to light.  Cardiovascular:     Pulses: Normal pulses.     Heart sounds: Normal heart sounds.  Pulmonary:     Effort: Pulmonary effort is normal.     Breath sounds: Normal breath sounds.  Neurological:     General: No focal deficit present.  Mental Status: She is alert.  Psychiatric:        Mood and Affect: Mood normal.        Behavior: Behavior normal.        Assessment/Plan: 1. Primary hypertension Stable, continue medications as prescribed  2. 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  Dispense: 30 capsule; Refill: 0 - amphetamine-dextroamphetamine (ADDERALL XR) 30 MG 24 hr capsule; Take 1 capsule (30 mg total) by mouth daily.  Dispense: 30 capsule; Refill: 0 - amphetamine-dextroamphetamine (ADDERALL XR) 30 MG 24 hr capsule; Take 1 capsule (30 mg total) by mouth daily.  Dispense: 30 capsule; Refill: 0 - sertraline (ZOLOFT) 25 MG tablet; Take 1 tablet (25 mg total) by mouth daily.  Dispense: 90 tablet; Refill: 1  3. Body image disorder Working well, continue as prescribed - sertraline (ZOLOFT) 25 MG tablet; Take 1 tablet (25 mg total) by mouth daily.  Dispense: 90 tablet; Refill: 1   General Counseling: Daphnie verbalizes understanding of the findings of todays visit and agrees with plan of treatment. I have discussed any further diagnostic evaluation that may be needed or ordered today. We also reviewed her medications today. she has been encouraged to call the office with any questions or concerns that should  arise related to todays visit.    No orders of the defined types were placed in this encounter.   Meds ordered this encounter  Medications   amphetamine-dextroamphetamine (ADDERALL XR) 30 MG 24 hr capsule    Sig: Take 1 capsule (30 mg total) by mouth daily.    Dispense:  30 capsule    Refill:  0    Fill for 11/2   amphetamine-dextroamphetamine (ADDERALL XR) 30 MG 24 hr capsule    Sig: Take 1 capsule (30 mg total) by mouth daily.    Dispense:  30 capsule    Refill:  0    Fill for nov 30   amphetamine-dextroamphetamine (ADDERALL XR) 30 MG 24 hr capsule    Sig: Take 1 capsule (30 mg total) by mouth daily.    Dispense:  30 capsule    Refill:  0    Fill for dec 28   sertraline (ZOLOFT) 25 MG tablet    Sig: Take 1 tablet (25 mg total) by mouth daily.    Dispense:  90 tablet    Refill:  1    90 day supply please    Return in 3 months (on 12/13/2022) for F/U, med refill, Jilda Kress PCP.   Total time spent:30 Minutes Time spent includes review of chart, medications, test results, and follow up plan with the patient.   [place] Controlled Substance Database was reviewed by me.  This patient was seen by Jonetta Osgood, FNP-C in collaboration with Dr. Clayborn Bigness as a part of collaborative care agreement.   Sophiya Morello R. Valetta Fuller, MSN, FNP-C Internal medicine

## 2022-10-08 ENCOUNTER — Encounter: Payer: BC Managed Care – PPO | Admitting: Nurse Practitioner

## 2022-10-16 ENCOUNTER — Telehealth: Payer: Self-pay | Admitting: Nurse Practitioner

## 2022-10-16 NOTE — Telephone Encounter (Signed)
Left vm and sent mychart message to confirm 10/23/22 appointment-Toni

## 2022-10-23 ENCOUNTER — Ambulatory Visit (INDEPENDENT_AMBULATORY_CARE_PROVIDER_SITE_OTHER): Payer: BC Managed Care – PPO | Admitting: Nurse Practitioner

## 2022-10-23 ENCOUNTER — Encounter: Payer: Self-pay | Admitting: Nurse Practitioner

## 2022-10-23 VITALS — BP 133/83 | HR 82 | Temp 96.2°F | Resp 16 | Ht 66.5 in | Wt 172.2 lb

## 2022-10-23 DIAGNOSIS — Z0001 Encounter for general adult medical examination with abnormal findings: Secondary | ICD-10-CM

## 2022-10-23 DIAGNOSIS — R7989 Other specified abnormal findings of blood chemistry: Secondary | ICD-10-CM

## 2022-10-23 DIAGNOSIS — F988 Other specified behavioral and emotional disorders with onset usually occurring in childhood and adolescence: Secondary | ICD-10-CM

## 2022-10-23 DIAGNOSIS — E538 Deficiency of other specified B group vitamins: Secondary | ICD-10-CM | POA: Diagnosis not present

## 2022-10-23 DIAGNOSIS — E782 Mixed hyperlipidemia: Secondary | ICD-10-CM

## 2022-10-23 DIAGNOSIS — R300 Dysuria: Secondary | ICD-10-CM

## 2022-10-23 DIAGNOSIS — Z1212 Encounter for screening for malignant neoplasm of rectum: Secondary | ICD-10-CM

## 2022-10-23 DIAGNOSIS — Z1211 Encounter for screening for malignant neoplasm of colon: Secondary | ICD-10-CM

## 2022-10-23 MED ORDER — AMPHETAMINE-DEXTROAMPHET ER 30 MG PO CP24: 30.0000 mg | ORAL_CAPSULE | Freq: Every day | ORAL | 0 refills | Status: DC

## 2022-10-23 NOTE — Progress Notes (Signed)
[facility] [place], [place] [address]  Internal MEDICINE  Office Visit Note  Patient Name: April Wilkins  [id number]  [id number]  Date of Service: 10/23/2022  Chief Complaint  Patient presents with   Annual Exam   Hypertension    HPI April Wilkins presents for an annual well visit and physical exam.  Well-appearing 61 y.o. female with hypertension, insomnia, ADD, seasonal allergies Routine CRC screening: due for cologuard in march Routine mammogram: mammogram due in January  DEXA scan: due in 2 years Pap smear: due next year 2024 Labs: due for routine labs New or worsening pain: none Other concerns: none    Current Medication: Outpatient Encounter Medications as of 10/23/2022  Medication Sig   hydrochlorothiazide (HYDRODIURIL) 12.5 MG tablet Take 1 tablet (12.5 mg total) by mouth daily.   lisinopril (ZESTRIL) 30 MG tablet TAKE 1 TABLET(30 MG) BY MOUTH DAILY   meloxicam (MOBIC) 15 MG tablet Take 1 tablet (15 mg total) by mouth daily.   sertraline (ZOLOFT) 25 MG tablet Take 1 tablet (25 mg total) by mouth daily.   valACYclovir (VALTREX) 1000 MG tablet Take 2 tablets at first sign of symptoms, repeat in 12 hours for 1 day dose   [DISCONTINUED] amphetamine-dextroamphetamine (ADDERALL XR) 30 MG 24 hr capsule Take 1 capsule (30 mg total) by mouth daily.   [DISCONTINUED] amphetamine-dextroamphetamine (ADDERALL XR) 30 MG 24 hr capsule Take 1 capsule (30 mg total) by mouth daily.   [DISCONTINUED] amphetamine-dextroamphetamine (ADDERALL XR) 30 MG 24 hr capsule Take 1 capsule (30 mg total) by mouth daily.   [START ON 01/02/2023] amphetamine-dextroamphetamine (ADDERALL XR) 30 MG 24 hr capsule Take 1 capsule (30 mg total) by mouth daily.   [START ON 01/30/2023] amphetamine-dextroamphetamine (ADDERALL XR) 30 MG 24 hr capsule Take 1 capsule (30 mg total) by mouth daily.   [START ON 12/05/2022] amphetamine-dextroamphetamine (ADDERALL XR) 30 MG 24 hr capsule Take 1  capsule (30 mg total) by mouth daily.   No facility-administered encounter medications on file as of 10/23/2022.    Surgical History: Past Surgical History:  Procedure Laterality Date   SKIN BIOPSY      Medical History: Past Medical History:  Diagnosis Date   Actinic keratosis 08/31/2010   Right forearm. Pigmented.   Allergy    Seasonal   Basal cell carcinoma (BCC) of right upper arm 02/08/2010   Right deltoid. Superficial.    Chicken pox    Dysplastic nevus 02/08/2010   R mid back bra line. Moderate to severe atypia, close to margin. Excised 03/28/2010, margins free.   Dysplastic nevus 02/08/2010   Left mid to low back above MM site. Moderate atypia, close to margin   Dysplastic nevus 02/08/2010   Left sup. lat. buttocks near side. Moderate atypia, close to margin.   Dysplastic nevus 05/22/2015   Left lower lat. back. Moderat to severe atypia, close to margin. Excised 06/12/2015, margins free.   Dysplastic nevus 03/31/2018   Central upper abdomen. Moderate atypia, limited margins free.   Frequent headaches    Hypertension    Melanoma ([place]) 2010   left lower back   Migraines    Squamous cell carcinoma of skin    back   Squamous cell carcinoma of skin 12/20/2020   L upper arm, SCC IS, EDC 01/17/21    Family History: Family History  Problem Relation Age of Onset   Hyperlipidemia Father    Heart disease Father    Diabetes Father    Cancer Father  Skin Cancer   Alcohol abuse Paternal Uncle    Hyperlipidemia Paternal Uncle    Heart disease Paternal Uncle    Kidney disease Maternal Grandfather    Hyperlipidemia Paternal Grandmother    Heart disease Paternal Grandmother    Drug abuse Paternal Grandmother    Breast cancer Maternal Grandmother 63    Social History   Socioeconomic History   Marital status: Single    Spouse name: Not on file   Number of children: Not on file   Years of education: Not on file   Highest education level: Not on file   Occupational History   Not on file  Tobacco Use   Smoking status: Never   Smokeless tobacco: Never  Substance and Sexual Activity   Alcohol use: Yes    Alcohol/week: 0.0 standard drinks of alcohol    Comment: Rare    Drug use: No   Sexual activity: Yes    Partners: Male  Other Topics Concern   Not on file  Social History Narrative   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     Social Determinants of Health   Financial Resource Strain: Not on file  Food Insecurity: Not on file  Transportation Needs: Not on file  Physical Activity: Not on file  Stress: Not on file  Social Connections: Not on file  Intimate Partner Violence: Not on file      Review of Systems  Constitutional:  Negative for activity change, appetite change, chills, fatigue, fever and unexpected weight change.  HENT: Negative.  Negative for congestion, ear pain, rhinorrhea, sore throat and trouble swallowing.   Eyes: Negative.   Respiratory: Negative.  Negative for cough, chest tightness, shortness of breath and wheezing.   Cardiovascular: Negative.  Negative for chest pain.  Gastrointestinal: Negative.  Negative for abdominal pain, blood in stool, constipation, diarrhea, nausea and vomiting.  Endocrine: Negative.   Genitourinary: Negative.  Negative for difficulty urinating, dysuria, frequency, hematuria and urgency.  Musculoskeletal: Negative.  Negative for arthralgias, back pain, joint swelling, myalgias and neck pain.  Skin: Negative.  Negative for rash and wound.  Allergic/Immunologic: Negative.  Negative for immunocompromised state.  Neurological: Negative.  Negative for dizziness, seizures, numbness and headaches.  Hematological: Negative.   Psychiatric/Behavioral: Negative.  Negative for behavioral problems, self-injury and suicidal ideas. The patient is not nervous/anxious.     Vital Signs: BP 133/83    Pulse 82   Temp (!) 96.2 F (35.7 C)   Resp 16   Ht 5' 6.5" (1.689 m)   Wt 172 lb 3.2 oz (78.1 kg)   SpO2 98%   BMI 27.38 kg/m    Physical Exam Vitals reviewed.  Constitutional:      General: She is awake. She is not in acute distress.    Appearance: Normal appearance. She is well-developed, well-groomed and overweight. She is not ill-appearing or diaphoretic.  HENT:     Head: Normocephalic and atraumatic.     Right Ear: Tympanic membrane, ear canal and external ear normal.     Left Ear: Tympanic membrane, ear canal and external ear normal.     Nose: Nose normal. No congestion or rhinorrhea.     Mouth/Throat:     Lips: Pink.     Mouth: Mucous membranes are moist.     Pharynx: Oropharynx is clear. Uvula midline. No oropharyngeal  exudate or posterior oropharyngeal erythema.  Eyes:     General: Lids are normal. Vision grossly intact. Gaze aligned appropriately. No scleral icterus.       Right eye: No discharge.        Left eye: No discharge.     Conjunctiva/sclera: Conjunctivae normal.     Pupils: Pupils are equal, round, and reactive to light.     Funduscopic exam:    Right eye: Red reflex present.        Left eye: Red reflex present. Neck:     Thyroid: No thyromegaly.     Vascular: No JVD.     Trachea: Trachea and phonation normal. No tracheal deviation.  Cardiovascular:     Rate and Rhythm: Normal rate and regular rhythm.     Pulses: Normal pulses.     Heart sounds: Normal heart sounds, S1 normal and S2 normal. No murmur heard.    No friction rub. No gallop.  Pulmonary:     Effort: Pulmonary effort is normal. No accessory muscle usage or respiratory distress.     Breath sounds: Normal breath sounds and air entry. No stridor. No wheezing or rales.  Chest:     Chest wall: No tenderness.     Comments: Declined clinical breast exam, gets annual mammograms Abdominal:     General: Bowel sounds are normal. There is no distension.     Palpations: Abdomen is soft. There is  no shifting dullness, fluid wave, mass or pulsatile mass.     Tenderness: There is no abdominal tenderness. There is no guarding or rebound.  Musculoskeletal:        General: No tenderness or deformity. Normal range of motion.     Cervical back: Normal range of motion and neck supple.     Right lower leg: No edema.     Left lower leg: No edema.  Lymphadenopathy:     Cervical: No cervical adenopathy.  Skin:    General: Skin is warm and dry.     Capillary Refill: Capillary refill takes less than 2 seconds.     Coloration: Skin is not pale.     Findings: No erythema or rash.  Neurological:     Mental Status: She is alert and oriented to person, place, and time.     Cranial Nerves: No cranial nerve deficit.     Motor: No abnormal muscle tone.     Coordination: Coordination normal.     Gait: Gait normal.     Deep Tendon Reflexes: Reflexes are normal and symmetric.  Psychiatric:        Mood and Affect: Mood normal.        Behavior: Behavior normal. Behavior is cooperative.        Thought Content: Thought content normal.        Judgment: Judgment normal.        Assessment/Plan: 1. Encounter for general adult medical examination with abnormal findings Age-appropriate preventive screenings and vaccinations discussed, annual physical exam completed. Routine labs for health maintenance ordered, see below. PHM updated.  - CBC with Differential/Platelet - CMP14+EGFR - Lipid Profile  2. Elevated ferritin level Routine lab ordered - Iron, TIBC and Ferritin Panel  3. Mixed hyperlipidemia Routine labs ordered - CMP14+EGFR - Lipid Profile  4. B12 deficiency Routine labs ordered - B12 and Folate Panel - CBC with Differential/Platelet - Iron, TIBC and Ferritin Panel  5. Dysuria Routine urinalysis done - UA/M w/rflx Culture, Routine  6. Screening for colorectal cancer Cologuard test  ordered - Cologuard  7. Attention deficit disorder (ADD) in adult Refills x3 months, follow  up in 3 months for additional refills - amphetamine-dextroamphetamine (ADDERALL XR) 30 MG 24 hr capsule; Take 1 capsule (30 mg total) by mouth daily.  Dispense: 30 capsule; Refill: 0 - amphetamine-dextroamphetamine (ADDERALL XR) 30 MG 24 hr capsule; Take 1 capsule (30 mg total) by mouth daily.  Dispense: 30 capsule; Refill: 0 - amphetamine-dextroamphetamine (ADDERALL XR) 30 MG 24 hr capsule; Take 1 capsule (30 mg total) by mouth daily.  Dispense: 30 capsule; Refill: 0    General Counseling: April Wilkins verbalizes understanding of the findings of todays visit and agrees with plan of treatment. I have discussed any further diagnostic evaluation that may be needed or ordered today. We also reviewed her medications today. she has been encouraged to call the office with any questions or concerns that should arise related to todays visit.    Orders Placed This Encounter  Procedures   Cologuard   B12 and Folate Panel   CBC with Differential/Platelet   CMP14+EGFR   Lipid Profile   Iron, TIBC and Ferritin Panel   UA/M w/rflx Culture, Routine    Meds ordered this encounter  Medications   amphetamine-dextroamphetamine (ADDERALL XR) 30 MG 24 hr capsule    Sig: Take 1 capsule (30 mg total) by mouth daily.    Dispense:  30 capsule    Refill:  0    Fill for february   amphetamine-dextroamphetamine (ADDERALL XR) 30 MG 24 hr capsule    Sig: Take 1 capsule (30 mg total) by mouth daily.    Dispense:  30 capsule    Refill:  0    Fill for march   amphetamine-dextroamphetamine (ADDERALL XR) 30 MG 24 hr capsule    Sig: Take 1 capsule (30 mg total) by mouth daily.    Dispense:  30 capsule    Refill:  0    Fill for January    Return in about 16 weeks (around 02/12/2023) for F/U, ADHD med check, April Wilkins PCP.   Total time spent:30 Minutes Time spent includes review of chart, medications, test results, and follow up plan with the patient.   [place] Controlled Substance Database was reviewed by me.  This  patient was seen by Jonetta Osgood, FNP-C in collaboration with Dr. Clayborn Bigness as a part of collaborative care agreement.  April Wilkins R. Valetta Fuller, MSN, FNP-C Internal medicine

## 2022-10-24 ENCOUNTER — Encounter: Payer: Self-pay | Admitting: Nurse Practitioner

## 2022-10-28 LAB — URINE CULTURE, REFLEX

## 2022-10-28 LAB — UA/M W/RFLX CULTURE, ROUTINE
Bilirubin, UA: NEGATIVE
Glucose, UA: NEGATIVE
Ketones, UA: NEGATIVE
Nitrite, UA: NEGATIVE
Protein,UA: NEGATIVE
RBC, UA: NEGATIVE
Specific Gravity, UA: 1.021 (ref 1.005–1.030)
Urobilinogen, Ur: 0.2 mg/dL (ref 0.2–1.0)
pH, UA: 5.5 (ref 5.0–7.5)

## 2022-10-28 LAB — MICROSCOPIC EXAMINATION
Bacteria, UA: NONE SEEN
Casts: NONE SEEN /lpf
Epithelial Cells (non renal): >10 /hpf — AB (ref 0–10)
RBC, Urine: NONE SEEN /hpf (ref 0–2)

## 2022-11-09 LAB — CMP14+EGFR
ALT: 19 IU/L (ref 0–32)
AST: 20 IU/L (ref 0–40)
Albumin/Globulin Ratio: 1.6 (ref 1.2–2.2)
Albumin: 3.9 g/dL (ref 3.9–4.9)
Alkaline Phosphatase: 91 IU/L (ref 44–121)
BUN/Creatinine Ratio: 29 — ABNORMAL HIGH (ref 12–28)
BUN: 25 mg/dL (ref 8–27)
Bilirubin Total: 0.2 mg/dL (ref 0.0–1.2)
CO2: 24 mmol/L (ref 20–29)
Calcium: 9.1 mg/dL (ref 8.7–10.3)
Chloride: 110 mmol/L — ABNORMAL HIGH (ref 96–106)
Creatinine, Ser: 0.85 mg/dL (ref 0.57–1.00)
Globulin, Total: 2.5 g/dL (ref 1.5–4.5)
Glucose: 101 mg/dL — ABNORMAL HIGH (ref 70–99)
Potassium: 4.1 mmol/L (ref 3.5–5.2)
Sodium: 147 mmol/L — ABNORMAL HIGH (ref 134–144)
Total Protein: 6.4 g/dL (ref 6.0–8.5)
eGFR: 77 mL/min/{1.73_m2} (ref >59)

## 2022-11-09 LAB — CBC WITH DIFFERENTIAL/PLATELET
Basophils Absolute: 0.1 10*3/uL (ref 0.0–0.2)
Basos: 1 %
EOS (ABSOLUTE): 0.2 10*3/uL (ref 0.0–0.4)
Eos: 3 %
Hematocrit: 40 % (ref 34.0–46.6)
Hemoglobin: 13.4 g/dL (ref 11.1–15.9)
Immature Grans (Abs): 0 10*3/uL (ref 0.0–0.1)
Immature Granulocytes: 0 %
Lymphocytes Absolute: 2 10*3/uL (ref 0.7–3.1)
Lymphs: 37 %
MCH: 29.6 pg (ref 26.6–33.0)
MCHC: 33.5 g/dL (ref 31.5–35.7)
MCV: 89 fL (ref 79–97)
Monocytes Absolute: 0.4 10*3/uL (ref 0.1–0.9)
Monocytes: 8 %
Neutrophils Absolute: 2.6 10*3/uL (ref 1.4–7.0)
Neutrophils: 51 %
Platelets: 348 10*3/uL (ref 150–450)
RBC: 4.52 x10E6/uL (ref 3.77–5.28)
RDW: 12.6 % (ref 11.7–15.4)
WBC: 5.2 10*3/uL (ref 3.4–10.8)

## 2022-11-09 LAB — LIPID PANEL
Chol/HDL Ratio: 3.6 ratio (ref 0.0–4.4)
Cholesterol, Total: 192 mg/dL (ref 100–199)
HDL: 53 mg/dL (ref >39)
LDL Chol Calc (NIH): 126 mg/dL — ABNORMAL HIGH (ref 0–99)
Triglycerides: 68 mg/dL (ref 0–149)
VLDL Cholesterol Cal: 13 mg/dL (ref 5–40)

## 2022-11-09 LAB — IRON,TIBC AND FERRITIN PANEL
Ferritin: 264 ng/mL — ABNORMAL HIGH (ref 15–150)
Iron Saturation: 28 % (ref 15–55)
Iron: 66 ug/dL (ref 27–139)
Total Iron Binding Capacity: 234 ug/dL — ABNORMAL LOW (ref 250–450)
UIBC: 168 ug/dL (ref 118–369)

## 2022-11-09 LAB — B12 AND FOLATE PANEL
Folate: 11.6 ng/mL (ref >3.0)
Vitamin B-12: 339 pg/mL (ref 232–1245)

## 2022-11-12 ENCOUNTER — Encounter: Payer: Self-pay | Admitting: Nurse Practitioner

## 2022-11-15 ENCOUNTER — Encounter: Payer: Self-pay | Admitting: Nurse Practitioner

## 2022-11-15 NOTE — Progress Notes (Signed)
I have reviewed the lab results. There are no critically abnormal values requiring immediate intervention but there are some abnormals.  -glucose of 101 is ok, this needs to be elevated multiple times to be of concern -sodium is high 147, and chloride of 111 -- please make sure you are drinking enough water (64 oz per day or 8 8-oz glasses per day) you may have been slightly dehydrated if you were fasting for your labs.  -liver and kidney function were both normal -lipid panel: call cholesterol levels are normal except LDL of 126 but this has still improved compared to previous. According to your cholesterol/HDL ratio, you are below average risk for cardiovascular disease.  -ferritin level is elevated at 264 but iron level is normal but this is ok. No intervention needed unless ferritin level is much high (>1000).  -Vitamin B12 low normal at 339 -- may help to take over-the-counter supplement, you can take 1000 mcg daily if desired  -the urine culture was positive for bacteria. If you have no symptoms of a UTI, this is called asymptomatic bacteriuria and is not treated with antibiotics except in pregnant women. If you are having symptoms of a UTI then this is treated like a UTI with antibiotics. Please let me know if you are having symptoms.    Any other labs that were not mentioned are normal.  I added additional explanation since I am sending this via mychart message since it is the weekend and I do not have a nurse to call you. Please let me know if you have any questions.

## 2022-11-19 NOTE — Telephone Encounter (Signed)
Spoke with pt April Wilkins already sent py My chart message

## 2022-12-09 ENCOUNTER — Ambulatory Visit: Payer: BC Managed Care – PPO | Admitting: Nurse Practitioner

## 2022-12-10 ENCOUNTER — Other Ambulatory Visit: Payer: Self-pay | Admitting: Nurse Practitioner

## 2022-12-10 DIAGNOSIS — Z1231 Encounter for screening mammogram for malignant neoplasm of breast: Secondary | ICD-10-CM

## 2022-12-12 ENCOUNTER — Other Ambulatory Visit: Payer: Self-pay | Admitting: Nurse Practitioner

## 2022-12-12 ENCOUNTER — Ambulatory Visit
Admission: RE | Admit: 2022-12-12 | Discharge: 2022-12-12 | Disposition: A | Discharge status: Home / Self Care | Payer: BC Managed Care – PPO | Source: Ambulatory Visit | Attending: Nurse Practitioner | Admitting: Nurse Practitioner

## 2022-12-12 DIAGNOSIS — Z1231 Encounter for screening mammogram for malignant neoplasm of breast: Secondary | ICD-10-CM

## 2022-12-12 DIAGNOSIS — F988 Other specified behavioral and emotional disorders with onset usually occurring in childhood and adolescence: Secondary | ICD-10-CM

## 2022-12-12 DIAGNOSIS — F4522 Body dysmorphic disorder: Secondary | ICD-10-CM

## 2022-12-16 ENCOUNTER — Other Ambulatory Visit: Payer: Self-pay | Admitting: Nurse Practitioner

## 2022-12-16 DIAGNOSIS — R928 Other abnormal and inconclusive findings on diagnostic imaging of breast: Secondary | ICD-10-CM

## 2022-12-18 ENCOUNTER — Ambulatory Visit
Admission: RE | Admit: 2022-12-18 | Discharge: 2022-12-18 | Disposition: A | Discharge status: Home / Self Care | Payer: BC Managed Care – PPO | Source: Ambulatory Visit | Attending: Nurse Practitioner | Admitting: Nurse Practitioner

## 2022-12-18 DIAGNOSIS — R928 Other abnormal and inconclusive findings on diagnostic imaging of breast: Secondary | ICD-10-CM | POA: Diagnosis present

## 2023-01-06 ENCOUNTER — Encounter: Payer: BC Managed Care – PPO | Admitting: Dermatology

## 2023-02-12 ENCOUNTER — Encounter: Payer: Self-pay | Admitting: Nurse Practitioner

## 2023-02-12 ENCOUNTER — Ambulatory Visit: Payer: BC Managed Care – PPO | Admitting: Nurse Practitioner

## 2023-02-12 VITALS — BP 130/70 | HR 70 | Temp 96.8°F | Resp 16 | Ht 66.5 in | Wt 173.2 lb

## 2023-02-12 DIAGNOSIS — F988 Other specified behavioral and emotional disorders with onset usually occurring in childhood and adolescence: Secondary | ICD-10-CM

## 2023-02-12 DIAGNOSIS — I10 Essential (primary) hypertension: Secondary | ICD-10-CM | POA: Diagnosis not present

## 2023-02-12 DIAGNOSIS — E782 Mixed hyperlipidemia: Secondary | ICD-10-CM | POA: Diagnosis not present

## 2023-02-12 MED ORDER — AMPHETAMINE-DEXTROAMPHET ER 30 MG PO CP24: 30.0000 mg | ORAL_CAPSULE | Freq: Every day | ORAL | 0 refills | Status: DC

## 2023-02-12 NOTE — Progress Notes (Signed)
[facility] [place], [place] [address]  Internal MEDICINE  Office Visit Note  Patient Name: April Wilkins  [id number]  [id number]  Date of Service: 02/12/2023  Chief Complaint  Patient presents with   Hypertension   Follow-up    HPI April Wilkins presents for a follow-up visit for ADHD, hypertension and hyperlipidemia.  ADHD -- current adderall dose is effective. Heart rate and BP are normal. She denies any palpitations or other adverse effects of the medication.  Hypertension -- slightly elevated but improved when rechecked. Takes lisinopril 30 mg daily.  Hyperlipidemia -- not on any medication at this time, most recent labs show some improvement from last year.     Current Medication: Outpatient Encounter Medications as of 02/12/2023  Medication Sig   hydrochlorothiazide (HYDRODIURIL) 12.5 MG tablet Take 1 tablet (12.5 mg total) by mouth daily.   lisinopril (ZESTRIL) 30 MG tablet TAKE 1 TABLET(30 MG) BY MOUTH DAILY   meloxicam (MOBIC) 15 MG tablet Take 1 tablet (15 mg total) by mouth daily.   sertraline (ZOLOFT) 25 MG tablet TAKE 1 TABLET(25 MG) BY MOUTH DAILY   valACYclovir (VALTREX) 1000 MG tablet Take 2 tablets at first sign of symptoms, repeat in 12 hours for 1 day dose   [DISCONTINUED] amphetamine-dextroamphetamine (ADDERALL XR) 30 MG 24 hr capsule Take 1 capsule (30 mg total) by mouth daily.   [DISCONTINUED] amphetamine-dextroamphetamine (ADDERALL XR) 30 MG 24 hr capsule Take 1 capsule (30 mg total) by mouth daily.   [DISCONTINUED] amphetamine-dextroamphetamine (ADDERALL XR) 30 MG 24 hr capsule Take 1 capsule (30 mg total) by mouth daily.   amphetamine-dextroamphetamine (ADDERALL XR) 30 MG 24 hr capsule Take 1 capsule (30 mg total) by mouth daily.   [START ON 03/12/2023] amphetamine-dextroamphetamine (ADDERALL XR) 30 MG 24 hr capsule Take 1 capsule (30 mg total) by mouth daily.   [START ON 04/09/2023] amphetamine-dextroamphetamine (ADDERALL XR) 30 MG 24  hr capsule Take 1 capsule (30 mg total) by mouth daily.   No facility-administered encounter medications on file as of 02/12/2023.    Surgical History: Past Surgical History:  Procedure Laterality Date   SKIN BIOPSY      Medical History: Past Medical History:  Diagnosis Date   Actinic keratosis 08/31/2010   Right forearm. Pigmented.   Allergy    Seasonal   Basal cell carcinoma (BCC) of right upper arm 02/08/2010   Right deltoid. Superficial.    Chicken pox    Dysplastic nevus 02/08/2010   R mid back bra line. Moderate to severe atypia, close to margin. Excised 03/28/2010, margins free.   Dysplastic nevus 02/08/2010   Left mid to low back above MM site. Moderate atypia, close to margin   Dysplastic nevus 02/08/2010   Left sup. lat. buttocks near side. Moderate atypia, close to margin.   Dysplastic nevus 05/22/2015   Left lower lat. back. Moderat to severe atypia, close to margin. Excised 06/12/2015, margins free.   Dysplastic nevus 03/31/2018   Central upper abdomen. Moderate atypia, limited margins free.   Frequent headaches    Hypertension    Melanoma 2010   left lower back   Migraines    Squamous cell carcinoma of skin    back   Squamous cell carcinoma of skin 12/20/2020   L upper arm, SCC IS, [facility] 01/17/21    Family History: Family History  Problem Relation Age of Onset   Hyperlipidemia Father    Heart disease Father    Diabetes Father    Cancer Father  Skin Cancer   Alcohol abuse Paternal Uncle    Hyperlipidemia Paternal Uncle    Heart disease Paternal Uncle    Kidney disease Maternal Grandfather    Hyperlipidemia Paternal Grandmother    Heart disease Paternal Grandmother    Drug abuse Paternal Grandmother    Breast cancer Maternal Grandmother 18    Social History   Socioeconomic History   Marital status: Single    Spouse name: Not on file   Number of children: Not on file   Years of education: Not on file   Highest education level: Not on file   Occupational History   Not on file  Tobacco Use   Smoking status: Never   Smokeless tobacco: Never  Substance and Sexual Activity   Alcohol use: Yes    Alcohol/week: 0.0 standard drinks of alcohol    Comment: Rare    Drug use: No   Sexual activity: Yes    Partners: Male  Other Topics Concern   Not on file  Social History Narrative   Divorced   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     Social Determinants of Health   Financial Resource Strain: Not on file  Food Insecurity: Not on file  Transportation Needs: Not on file  Physical Activity: Not on file  Stress: Not on file  Social Connections: Not on file  Intimate Partner Violence: Not on file      Review of Systems  Constitutional:  Negative for chills, fatigue and unexpected weight change.  HENT:  Positive for postnasal drip. Negative for congestion, rhinorrhea, sneezing and sore throat.   Eyes:  Negative for redness.  Respiratory:  Negative for cough, chest tightness and shortness of breath.   Cardiovascular:  Negative for chest pain and palpitations.  Gastrointestinal:  Negative for abdominal pain, constipation, diarrhea, nausea and vomiting.  Genitourinary:  Negative for dysuria and frequency.  Musculoskeletal:  Negative for arthralgias, back pain, joint swelling and neck pain.  Skin:  Negative for rash.  Neurological: Negative.  Negative for tremors and numbness.  Hematological:  Negative for adenopathy. Does not bruise/bleed easily.  Psychiatric/Behavioral:  Negative for behavioral problems (Depression), sleep disturbance and suicidal ideas. The patient is not nervous/anxious.     Vital Signs: BP 130/70 Comment: 142/81  Pulse 70   Temp (!) 96.8 F (36 C)   Resp 16   Ht 5' 6.5" (1.689 m)   Wt 173 lb 3.2 oz (78.6 kg)   SpO2 94%   BMI 27.54 kg/m    Physical Exam Vitals reviewed.  Constitutional:      Appearance:  Normal appearance.  HENT:     Head: Normocephalic and atraumatic.     Nose: Nose normal.     Mouth/Throat:     Mouth: Mucous membranes are moist.     Pharynx: No posterior oropharyngeal erythema.  Eyes:     Extraocular Movements: Extraocular movements intact.     Pupils: Pupils are equal, round, and reactive to light.  Cardiovascular:     Pulses: Normal pulses.     Heart sounds: Normal heart sounds.  Pulmonary:     Effort: Pulmonary effort is normal.     Breath sounds: Normal breath sounds.  Neurological:     General: No focal deficit present.     Mental Status: She is alert.  Psychiatric:  Mood and Affect: Mood normal.        Behavior: Behavior normal.        Assessment/Plan: 1. Primary hypertension Stable, continue lisinopril as prescribed.   2. Mixed hyperlipidemia Improving, not on any medications   3. Attention deficit disorder (ADD) in adult Refills ordered x3 months, follow up in 3 months for additional refills.  - amphetamine-dextroamphetamine (ADDERALL XR) 30 MG 24 hr capsule; Take 1 capsule (30 mg total) by mouth daily.  Dispense: 30 capsule; Refill: 0 - amphetamine-dextroamphetamine (ADDERALL XR) 30 MG 24 hr capsule; Take 1 capsule (30 mg total) by mouth daily.  Dispense: 30 capsule; Refill: 0 - amphetamine-dextroamphetamine (ADDERALL XR) 30 MG 24 hr capsule; Take 1 capsule (30 mg total) by mouth daily.  Dispense: 30 capsule; Refill: 0   General Counseling: April Wilkins verbalizes understanding of the findings of todays visit and agrees with plan of treatment. I have discussed any further diagnostic evaluation that may be needed or ordered today. We also reviewed her medications today. she has been encouraged to call the office with any questions or concerns that should arise related to todays visit.    No orders of the defined types were placed in this encounter.   Meds ordered this encounter  Medications   amphetamine-dextroamphetamine (ADDERALL XR) 30  MG 24 hr capsule    Sig: Take 1 capsule (30 mg total) by mouth daily.    Dispense:  30 capsule    Refill:  0    Fill for april   amphetamine-dextroamphetamine (ADDERALL XR) 30 MG 24 hr capsule    Sig: Take 1 capsule (30 mg total) by mouth daily.    Dispense:  30 capsule    Refill:  0    Fill for may   amphetamine-dextroamphetamine (ADDERALL XR) 30 MG 24 hr capsule    Sig: Take 1 capsule (30 mg total) by mouth daily.    Dispense:  30 capsule    Refill:  0    Fill for 04/09/23    Return in about 12 weeks (around 05/07/2023) for F/U, ADHD med check, [place] PCP.   Total time spent:30 Minutes Time spent includes review of chart, medications, test results, and follow up plan with the patient.    Controlled Substance Database was reviewed by me.  This patient was seen by Jonetta Osgood, FNP-C in collaboration with Dr. Clayborn Bigness as a part of collaborative care agreement.   Jozeph Persing R. Valetta Fuller, MSN, FNP-C Internal medicine

## 2023-02-16 LAB — COLOGUARD: COLOGUARD: NEGATIVE

## 2023-02-21 ENCOUNTER — Telehealth: Payer: Self-pay

## 2023-02-21 NOTE — Progress Notes (Signed)
Cologuard is negative.  Repeat in 3 years

## 2023-02-21 NOTE — Telephone Encounter (Signed)
Patient notified

## 2023-02-21 NOTE — Telephone Encounter (Signed)
-----   Message from Sallyanne Kuster, NP sent at 02/21/2023  8:21 AM EDT ----- Cologuard is negative. Repeat in 3 years.

## 2023-05-07 ENCOUNTER — Ambulatory Visit: Payer: BC Managed Care – PPO | Admitting: Nurse Practitioner

## 2023-05-13 ENCOUNTER — Ambulatory Visit: Payer: BC Managed Care – PPO | Admitting: Nurse Practitioner

## 2023-05-13 ENCOUNTER — Encounter: Payer: Self-pay | Admitting: Nurse Practitioner

## 2023-05-13 VITALS — BP 130/86 | HR 74 | Temp 98.1°F | Resp 16 | Ht 66.5 in | Wt 173.0 lb

## 2023-05-13 DIAGNOSIS — F4522 Body dysmorphic disorder: Secondary | ICD-10-CM

## 2023-05-13 DIAGNOSIS — I10 Essential (primary) hypertension: Secondary | ICD-10-CM

## 2023-05-13 DIAGNOSIS — F988 Other specified behavioral and emotional disorders with onset usually occurring in childhood and adolescence: Secondary | ICD-10-CM | POA: Diagnosis not present

## 2023-05-13 DIAGNOSIS — F419 Anxiety disorder, unspecified: Secondary | ICD-10-CM

## 2023-05-13 DIAGNOSIS — R609 Edema, unspecified: Secondary | ICD-10-CM

## 2023-05-13 MED ORDER — AMPHETAMINE-DEXTROAMPHET ER 30 MG PO CP24: 30.0000 mg | ORAL_CAPSULE | Freq: Every day | ORAL | 0 refills | Status: DC

## 2023-05-13 MED ORDER — SERTRALINE HCL 25 MG PO TABS: ORAL_TABLET | ORAL | 1 refills | Status: DC

## 2023-05-13 MED ORDER — HYDROCHLOROTHIAZIDE 12.5 MG PO TABS: 12.5000 mg | ORAL_TABLET | Freq: Every day | ORAL | 3 refills | Status: DC

## 2023-05-13 MED ORDER — OLMESARTAN MEDOXOMIL 20 MG PO TABS: 20.0000 mg | ORAL_TABLET | Freq: Every day | ORAL | 3 refills | Status: DC

## 2023-05-13 NOTE — Progress Notes (Signed)
[facility] [address] [place], [place] [address]  Internal MEDICINE  Office Visit Note  Patient Name: April Wilkins  [id number]  [id number]  Date of Service: 05/13/2023  Chief Complaint  Patient presents with   Hypertension   Follow-up    ADHD med check     HPI April Wilkins presents for a follow-up visit for hypertension, ADHD, anxiety, and nagging dry cough.  Nagging dry cough -- due to lisinopril, wants to switch to a different BP medication.  ADHD -- due for refills. Current dose remains effective per patient. Hear rate and BP are normal. Denies any palpitations or other adverse side effects of the medication.  Anxiety -- taking sertraline 25 mg daily, due for refills    Current Medication: Outpatient Encounter Medications as of 05/13/2023  Medication Sig   meloxicam (MOBIC) 15 MG tablet Take 1 tablet (15 mg total) by mouth daily.   olmesartan (BENICAR) 20 MG tablet Take 1 tablet (20 mg total) by mouth daily.   valACYclovir (VALTREX) 1000 MG tablet Take 2 tablets at first sign of symptoms, repeat in 12 hours for 1 day dose   [DISCONTINUED] amphetamine-dextroamphetamine (ADDERALL XR) 30 MG 24 hr capsule Take 1 capsule (30 mg total) by mouth daily.   [DISCONTINUED] amphetamine-dextroamphetamine (ADDERALL XR) 30 MG 24 hr capsule Take 1 capsule (30 mg total) by mouth daily.   [DISCONTINUED] amphetamine-dextroamphetamine (ADDERALL XR) 30 MG 24 hr capsule Take 1 capsule (30 mg total) by mouth daily.   [DISCONTINUED] hydrochlorothiazide (HYDRODIURIL) 12.5 MG tablet Take 1 tablet (12.5 mg total) by mouth daily.   [DISCONTINUED] lisinopril (ZESTRIL) 30 MG tablet TAKE 1 TABLET(30 MG) BY MOUTH DAILY   [DISCONTINUED] sertraline (ZOLOFT) 25 MG tablet TAKE 1 TABLET(25 MG) BY MOUTH DAILY   [START ON 07/08/2023] amphetamine-dextroamphetamine (ADDERALL XR) 30 MG 24 hr capsule Take 1 capsule (30 mg total) by mouth daily.   [START ON 06/10/2023] amphetamine-dextroamphetamine (ADDERALL  XR) 30 MG 24 hr capsule Take 1 capsule (30 mg total) by mouth daily.   amphetamine-dextroamphetamine (ADDERALL XR) 30 MG 24 hr capsule Take 1 capsule (30 mg total) by mouth daily.   hydrochlorothiazide (HYDRODIURIL) 12.5 MG tablet Take 1 tablet (12.5 mg total) by mouth daily.   sertraline (ZOLOFT) 25 MG tablet TAKE 1 TABLET(25 MG) BY MOUTH DAILY   No facility-administered encounter medications on file as of 05/13/2023.    Surgical History: Past Surgical History:  Procedure Laterality Date   SKIN BIOPSY      Medical History: Past Medical History:  Diagnosis Date   Actinic keratosis 08/31/2010   Right forearm. Pigmented.   Allergy    Seasonal   Basal cell carcinoma (BCC) of right upper arm 02/08/2010   Right deltoid. Superficial.    Chicken pox    Dysplastic nevus 02/08/2010   R mid back bra line. Moderate to severe atypia, close to margin. Excised 03/28/2010, margins free.   Dysplastic nevus 02/08/2010   Left mid to low back above MM site. Moderate atypia, close to margin   Dysplastic nevus 02/08/2010   Left sup. lat. buttocks near side. Moderate atypia, close to margin.   Dysplastic nevus 05/22/2015   Left lower lat. back. Moderat to severe atypia, close to margin. Excised 06/12/2015, margins free.   Dysplastic nevus 03/31/2018   Central upper abdomen. Moderate atypia, limited margins free.   Frequent headaches    Hypertension    Melanoma (HCC) 2010   left lower back   Migraines    Squamous cell  carcinoma of skin    back   Squamous cell carcinoma of skin 12/20/2020   L upper arm, SCC IS, [facility] 01/17/21    Family History: Family History  Problem Relation Age of Onset   Hyperlipidemia Father    Heart disease Father    Diabetes Father    Cancer Father        Skin Cancer   Alcohol abuse Paternal Uncle    Hyperlipidemia Paternal Uncle    Heart disease Paternal Uncle    Kidney disease Maternal Grandfather    Hyperlipidemia Paternal Grandmother    Heart disease Paternal  Grandmother    Drug abuse Paternal Grandmother    Breast cancer Maternal Grandmother 11    Social History   Socioeconomic History   Marital status: Single    Spouse name: Not on file   Number of children: Not on file   Years of education: Not on file   Highest education level: Not on file  Occupational History   Not on file  Tobacco Use   Smoking status: Never   Smokeless tobacco: Never  Substance and Sexual Activity   Alcohol use: Yes    Alcohol/week: 0.0 standard drinks of alcohol    Comment: Rare    Drug use: No   Sexual activity: Yes    Partners: Male  Other Topics Concern   Not on file  Social History Narrative   Divorced   Goes by [employer]    Employed- Furniture conservator/restorer    2 children   Caffeine- No coffee, tea and soda daily 24 oz.     Social Determinants of Health   Financial Resource Strain: Not on file  Food Insecurity: Not on file  Transportation Needs: Not on file  Physical Activity: Not on file  Stress: Not on file  Social Connections: Not on file  Intimate Partner Violence: Not on file      Review of Systems  Constitutional:  Negative for chills, fatigue and unexpected weight change.  HENT:  Positive for postnasal drip. Negative for congestion, rhinorrhea, sneezing and sore throat.   Eyes:  Negative for redness.  Respiratory:  Positive for cough (nagging and dry). Negative for chest tightness and shortness of breath.   Cardiovascular: Negative.  Negative for chest pain and palpitations.  Gastrointestinal: Negative.  Negative for abdominal pain, constipation, diarrhea, nausea and vomiting.  Genitourinary: Negative.  Negative for dysuria and frequency.  Musculoskeletal: Negative.  Negative for arthralgias, back pain, joint swelling and neck pain.  Skin:  Negative for rash.  Neurological: Negative.  Negative for tremors and numbness.  Hematological:  Negative for adenopathy. Does not bruise/bleed easily.  Psychiatric/Behavioral:   Negative for behavioral problems (Depression), sleep disturbance and suicidal ideas. The patient is not nervous/anxious.     Vital Signs: BP 130/86   Pulse 74   Temp 98.1 F (36.7 C)   Resp 16   Ht 5' 6.5" (1.689 m)   Wt 173 lb (78.5 kg)   SpO2 98%   BMI 27.50 kg/m    Physical Exam Vitals reviewed.  Constitutional:      General: She is not in acute distress.    Appearance: Normal appearance. She is not ill-appearing.  HENT:     Head: Normocephalic and atraumatic.  Eyes:     Pupils: Pupils are equal, round, and reactive to light.  Cardiovascular:     Rate and Rhythm: Normal rate and regular rhythm.  Pulmonary:     Effort: Pulmonary  effort is normal. No respiratory distress.  Neurological:     Mental Status: She is alert and oriented to person, place, and time.  Psychiatric:        Mood and Affect: Mood normal.        Behavior: Behavior normal.        Assessment/Plan: 1. Primary hypertension Continue hydrochlorothiazide as prescribed.  May stop lisinopril and wait a few days before start olmesartan as prescribed. Follow up in 4 weeks  - hydrochlorothiazide (HYDRODIURIL) 12.5 MG tablet; Take 1 tablet (12.5 mg total) by mouth daily.  Dispense: 90 tablet; Refill: 3 - olmesartan (BENICAR) 20 MG tablet; Take 1 tablet (20 mg total) by mouth daily.  Dispense: 30 tablet; Refill: 3  2. Water retention Continue hydrochlorothiazide as prescribed - hydrochlorothiazide (HYDRODIURIL) 12.5 MG tablet; Take 1 tablet (12.5 mg total) by mouth daily.  Dispense: 90 tablet; Refill: 3  3. Attention deficit disorder (ADD) in adult Continue adderall as prescribed. Follow up in 3 months for additional refills.  - sertraline (ZOLOFT) 25 MG tablet; TAKE 1 TABLET(25 MG) BY MOUTH DAILY  Dispense: 90 tablet; Refill: 1 - amphetamine-dextroamphetamine (ADDERALL XR) 30 MG 24 hr capsule; Take 1 capsule (30 mg total) by mouth daily.  Dispense: 30 capsule; Refill: 0 - amphetamine-dextroamphetamine  (ADDERALL XR) 30 MG 24 hr capsule; Take 1 capsule (30 mg total) by mouth daily.  Dispense: 30 capsule; Refill: 0 - amphetamine-dextroamphetamine (ADDERALL XR) 30 MG 24 hr capsule; Take 1 capsule (30 mg total) by mouth daily.  Dispense: 30 capsule; Refill: 0  4. Anxiety Continue sertraline as prescribed.  - sertraline (ZOLOFT) 25 MG tablet; TAKE 1 TABLET(25 MG) BY MOUTH DAILY  Dispense: 90 tablet; Refill: 1  5. Body image disorder Continue sertraline as prescribed.  - sertraline (ZOLOFT) 25 MG tablet; TAKE 1 TABLET(25 MG) BY MOUTH DAILY  Dispense: 90 tablet; Refill: 1   General Counseling: Anavictoria verbalizes understanding of the findings of todays visit and agrees with plan of treatment. I have discussed any further diagnostic evaluation that may be needed or ordered today. We also reviewed her medications today. she has been encouraged to call the office with any questions or concerns that should arise related to todays visit.    No orders of the defined types were placed in this encounter.   Meds ordered this encounter  Medications   sertraline (ZOLOFT) 25 MG tablet    Sig: TAKE 1 TABLET(25 MG) BY MOUTH DAILY    Dispense:  90 tablet    Refill:  1    For future refills   hydrochlorothiazide (HYDRODIURIL) 12.5 MG tablet    Sig: Take 1 tablet (12.5 mg total) by mouth daily.    Dispense:  90 tablet    Refill:  3    For future refills, keep on file   amphetamine-dextroamphetamine (ADDERALL XR) 30 MG 24 hr capsule    Sig: Take 1 capsule (30 mg total) by mouth daily.    Dispense:  30 capsule    Refill:  0    Fill for August 27   amphetamine-dextroamphetamine (ADDERALL XR) 30 MG 24 hr capsule    Sig: Take 1 capsule (30 mg total) by mouth daily.    Dispense:  30 capsule    Refill:  0    Fill for July 30   amphetamine-dextroamphetamine (ADDERALL XR) 30 MG 24 hr capsule    Sig: Take 1 capsule (30 mg total) by mouth daily.    Dispense:  30 capsule  Refill:  0    Fill for July 2    olmesartan (BENICAR) 20 MG tablet    Sig: Take 1 tablet (20 mg total) by mouth daily.    Dispense:  30 tablet    Refill:  3    Discontinue lisinopril, fill new script today.    Return in about 4 weeks (around 06/10/2023) for F/U, eval new med, BP check, [facility] PCP.   Total time spent:30 Minutes Time spent includes review of chart, medications, test results, and follow up plan with the patient.   [place] Controlled Substance Database was reviewed by me.  This patient was seen by Sallyanne Kuster, FNP-C in collaboration with Dr. Beverely Risen as a part of collaborative care agreement.   Arnez Stoneking R. Tedd Sias, MSN, FNP-C Internal medicine

## 2023-06-10 ENCOUNTER — Encounter: Payer: Self-pay | Admitting: Nurse Practitioner

## 2023-06-10 ENCOUNTER — Ambulatory Visit: Payer: BC Managed Care – PPO | Admitting: Nurse Practitioner

## 2023-06-10 VITALS — BP 128/86 | HR 67 | Temp 97.3°F | Resp 16 | Ht 66.5 in | Wt 172.4 lb

## 2023-06-10 DIAGNOSIS — I10 Essential (primary) hypertension: Secondary | ICD-10-CM | POA: Diagnosis not present

## 2023-06-10 DIAGNOSIS — E538 Deficiency of other specified B group vitamins: Secondary | ICD-10-CM | POA: Diagnosis not present

## 2023-06-10 DIAGNOSIS — R7301 Impaired fasting glucose: Secondary | ICD-10-CM | POA: Diagnosis not present

## 2023-06-10 DIAGNOSIS — E782 Mixed hyperlipidemia: Secondary | ICD-10-CM

## 2023-06-10 DIAGNOSIS — E559 Vitamin D deficiency, unspecified: Secondary | ICD-10-CM

## 2023-06-10 DIAGNOSIS — R7989 Other specified abnormal findings of blood chemistry: Secondary | ICD-10-CM

## 2023-06-10 MED ORDER — OLMESARTAN MEDOXOMIL 20 MG PO TABS: 10.0000 mg | ORAL_TABLET | Freq: Every day | ORAL | Status: DC

## 2023-06-10 NOTE — Progress Notes (Signed)
[facility] [address] [place], [place] [address]  Internal MEDICINE  Office Visit Note  Patient Name: April Wilkins  [id number]  [id number]  Date of Service: 06/10/2023  Chief Complaint  Patient presents with   Hypertension   Follow-up    HPI April Wilkins presents for a follow-up visit for hypertension  Blood pressure improved with 1/2 tablet, dropped slightly too much with a whole tablet.  Due for her annual physical exam in December and her annual routine labs.  Borderline low normal B12 last year Elevated ferritin last year Low vitamin D level in 2021 at 18.1 Elevated LDL 126 last year.  Hypernatremia and slightly elevated glucose last year.  Will repeat labs to follow up on these abnormals.    Current Medication: Outpatient Encounter Medications as of 06/10/2023  Medication Sig   [START ON 07/08/2023] amphetamine-dextroamphetamine (ADDERALL XR) 30 MG 24 hr capsule Take 1 capsule (30 mg total) by mouth daily.   amphetamine-dextroamphetamine (ADDERALL XR) 30 MG 24 hr capsule Take 1 capsule (30 mg total) by mouth daily.   amphetamine-dextroamphetamine (ADDERALL XR) 30 MG 24 hr capsule Take 1 capsule (30 mg total) by mouth daily.   hydrochlorothiazide (HYDRODIURIL) 12.5 MG tablet Take 1 tablet (12.5 mg total) by mouth daily.   meloxicam (MOBIC) 15 MG tablet Take 1 tablet (15 mg total) by mouth daily.   sertraline (ZOLOFT) 25 MG tablet TAKE 1 TABLET(25 MG) BY MOUTH DAILY   valACYclovir (VALTREX) 1000 MG tablet Take 2 tablets at first sign of symptoms, repeat in 12 hours for 1 day dose   [DISCONTINUED] olmesartan (BENICAR) 20 MG tablet Take 1 tablet (20 mg total) by mouth daily.   olmesartan (BENICAR) 20 MG tablet Take 0.5 tablets (10 mg total) by mouth daily.   No facility-administered encounter medications on file as of 06/10/2023.    Surgical History: Past Surgical History:  Procedure Laterality Date   SKIN BIOPSY      Medical History: Past Medical  History:  Diagnosis Date   Actinic keratosis 08/31/2010   Right forearm. Pigmented.   Allergy    Seasonal   Basal cell carcinoma (BCC) of right upper arm 02/08/2010   Right deltoid. Superficial.    Chicken pox    Dysplastic nevus 02/08/2010   R mid back bra line. Moderate to severe atypia, close to margin. Excised 03/28/2010, margins free.   Dysplastic nevus 02/08/2010   Left mid to low back above MM site. Moderate atypia, close to margin   Dysplastic nevus 02/08/2010   Left sup. lat. buttocks near side. Moderate atypia, close to margin.   Dysplastic nevus 05/22/2015   Left lower lat. back. Moderat to severe atypia, close to margin. Excised 06/12/2015, margins free.   Dysplastic nevus 03/31/2018   Central upper abdomen. Moderate atypia, limited margins free.   Frequent headaches    Hypertension    Melanoma (HCC) 2010   left lower back   Migraines    Squamous cell carcinoma of skin    back   Squamous cell carcinoma of skin 12/20/2020   L upper arm, SCC IS, EDC 01/17/21    Family History: Family History  Problem Relation Age of Onset   Hyperlipidemia Father    Heart disease Father    Diabetes Father    Cancer Father        Skin Cancer   Alcohol abuse Paternal Uncle    Hyperlipidemia Paternal Uncle    Heart disease Paternal Uncle    Kidney disease Maternal  Grandfather    Hyperlipidemia Paternal Grandmother    Heart disease Paternal Grandmother    Drug abuse Paternal Grandmother    Breast cancer Maternal Grandmother 58    Social History   Socioeconomic History   Marital status: Single    Spouse name: Not on file   Number of children: Not on file   Years of education: Not on file   Highest education level: Not on file  Occupational History   Not on file  Tobacco Use   Smoking status: Never   Smokeless tobacco: Never  Substance and Sexual Activity   Alcohol use: Yes    Alcohol/week: 0.0 standard drinks of alcohol    Comment: Rare    Drug use: No   Sexual  activity: Yes    Partners: Male  Other Topics Concern   Not on file  Social History Narrative   Divorced   Goes by [employer]    Employed- Furniture conservator/restorer    2 children   Caffeine- No coffee, tea and soda daily 24 oz.     Social Determinants of Health   Financial Resource Strain: Not on file  Food Insecurity: Not on file  Transportation Needs: Not on file  Physical Activity: Not on file  Stress: Not on file  Social Connections: Not on file  Intimate Partner Violence: Not on file      Review of Systems  Constitutional:  Negative for chills, fatigue and unexpected weight change.  HENT:  Positive for postnasal drip. Negative for congestion, rhinorrhea, sneezing and sore throat.   Eyes:  Negative for redness.  Respiratory:  Negative for cough, chest tightness and shortness of breath.   Cardiovascular: Negative.  Negative for chest pain and palpitations.  Gastrointestinal: Negative.  Negative for abdominal pain, constipation, diarrhea, nausea and vomiting.  Genitourinary: Negative.  Negative for dysuria and frequency.  Musculoskeletal: Negative.  Negative for arthralgias, back pain, joint swelling and neck pain.  Skin:  Negative for rash.  Neurological: Negative.  Negative for tremors and numbness.  Hematological:  Negative for adenopathy. Does not bruise/bleed easily.  Psychiatric/Behavioral:  Negative for behavioral problems (Depression), sleep disturbance and suicidal ideas. The patient is not nervous/anxious.     Vital Signs: BP 128/86   Pulse 67   Temp (!) 97.3 F (36.3 C)   Resp 16   Ht 5' 6.5" (1.689 m)   Wt 172 lb 6.4 oz (78.2 kg)   SpO2 97%   BMI 27.41 kg/m    Physical Exam Vitals reviewed.  Constitutional:      General: She is not in acute distress.    Appearance: Normal appearance. She is not ill-appearing.  HENT:     Head: Normocephalic and atraumatic.  Eyes:     Pupils: Pupils are equal, round, and reactive to light.  Cardiovascular:      Rate and Rhythm: Normal rate and regular rhythm.  Pulmonary:     Effort: Pulmonary effort is normal. No respiratory distress.  Neurological:     Mental Status: She is alert and oriented to person, place, and time.  Psychiatric:        Mood and Affect: Mood normal.        Behavior: Behavior normal.        Assessment/Plan: 1. Primary hypertension Continue olmesartan 1/2 tablet daily as prescribed. Routine labs ordered for December  - olmesartan (BENICAR) 20 MG tablet; Take 0.5 tablets (10 mg total) by mouth daily. - CBC with Differential/Platelet - CMP14+EGFR -  Lipid Profile  2. Impaired fasting glucose Routine labs ordered  - CMP14+EGFR - Hgb A1C w/o eAG  3. B12 deficiency Routine labs ordered  - B12 and Folate Panel  4. Elevated ferritin level Routine labs ordered  - Iron, TIBC and Ferritin Panel  5. Mixed hyperlipidemia Routine labs ordered  - CBC with Differential/Platelet - CMP14+EGFR - Lipid Profile  6. Vitamin D deficiency Routine labs ordered  - Vitamin D (25 hydroxy)   General Counseling: Georgine verbalizes understanding of the findings of todays visit and agrees with plan of treatment. I have discussed any further diagnostic evaluation that may be needed or ordered today. We also reviewed her medications today. she has been encouraged to call the office with any questions or concerns that should arise related to todays visit.    Orders Placed This Encounter  Procedures   CBC with Differential/Platelet   CMP14+EGFR   Lipid Profile   Vitamin D (25 hydroxy)   B12 and Folate Panel   Iron, TIBC and Ferritin Panel   Hgb A1C w/o eAG    Meds ordered this encounter  Medications   olmesartan (BENICAR) 20 MG tablet    Sig: Take 0.5 tablets (10 mg total) by mouth daily.    Return in about 5 months (around 10/27/2023) for CPE,  PCP needs to be scheduled.   Total time spent:30 Minutes Time spent includes review of chart, medications, test  results, and follow up plan with the patient.   [place] Controlled Substance Database was reviewed by me.  This patient was seen by Sallyanne Kuster, FNP-C in collaboration with Dr. Beverely Risen as a part of collaborative care agreement.    R. Tedd Sias, MSN, FNP-C Internal medicine

## 2023-06-24 ENCOUNTER — Ambulatory Visit: Payer: BC Managed Care – PPO | Admitting: Dermatology

## 2023-07-23 ENCOUNTER — Ambulatory Visit (INDEPENDENT_AMBULATORY_CARE_PROVIDER_SITE_OTHER): Payer: BC Managed Care – PPO | Admitting: Dermatology

## 2023-07-23 ENCOUNTER — Encounter: Payer: Self-pay | Admitting: Dermatology

## 2023-07-23 DIAGNOSIS — L853 Xerosis cutis: Secondary | ICD-10-CM

## 2023-07-23 DIAGNOSIS — L820 Inflamed seborrheic keratosis: Secondary | ICD-10-CM

## 2023-07-23 DIAGNOSIS — D229 Melanocytic nevi, unspecified: Secondary | ICD-10-CM

## 2023-07-23 DIAGNOSIS — Z85820 Personal history of malignant melanoma of skin: Secondary | ICD-10-CM

## 2023-07-23 DIAGNOSIS — L814 Other melanin hyperpigmentation: Secondary | ICD-10-CM | POA: Diagnosis not present

## 2023-07-23 DIAGNOSIS — D1801 Hemangioma of skin and subcutaneous tissue: Secondary | ICD-10-CM

## 2023-07-23 DIAGNOSIS — L821 Other seborrheic keratosis: Secondary | ICD-10-CM

## 2023-07-23 DIAGNOSIS — Z872 Personal history of diseases of the skin and subcutaneous tissue: Secondary | ICD-10-CM

## 2023-07-23 DIAGNOSIS — Z85828 Personal history of other malignant neoplasm of skin: Secondary | ICD-10-CM

## 2023-07-23 DIAGNOSIS — Z86018 Personal history of other benign neoplasm: Secondary | ICD-10-CM

## 2023-07-23 DIAGNOSIS — Z1283 Encounter for screening for malignant neoplasm of skin: Secondary | ICD-10-CM | POA: Diagnosis not present

## 2023-07-23 DIAGNOSIS — L578 Other skin changes due to chronic exposure to nonionizing radiation: Secondary | ICD-10-CM

## 2023-07-23 DIAGNOSIS — W908XXA Exposure to other nonionizing radiation, initial encounter: Secondary | ICD-10-CM

## 2023-07-23 NOTE — Progress Notes (Signed)
Follow-Up Visit   Subjective  April Wilkins is a 62 y.o. female who presents for the following: Skin Cancer Screening and Full Body Skin Exam  The patient presents for Total-Body Skin Exam (TBSE) for skin cancer screening and mole check. The patient has spots, moles and lesions to be evaluated, some may be new or changing and the patient may have concern these could be cancer.  Patient with hx of MM, SCC, BCC, AK and dysplastic nevi.  The following portions of the chart were reviewed this encounter and updated as appropriate: medications, allergies, medical history  Review of Systems:  No other skin or systemic complaints except as noted in HPI or Assessment and Plan.  Objective  Well appearing patient in no apparent distress; mood and affect are within normal limits.  A full examination was performed including scalp, head, eyes, ears, nose, lips, neck, chest, axillae, abdomen, back, buttocks, bilateral upper extremities, bilateral lower extremities, hands, feet, fingers, toes, fingernails, and toenails. All findings within normal limits unless otherwise noted below.   Exam of nails limited by presence of nail polish.  Relevant physical exam findings are noted in the Assessment and Plan.    Assessment & Plan   SKIN CANCER SCREENING PERFORMED TODAY.  ACTINIC DAMAGE - Chronic condition, secondary to cumulative UV/sun exposure - diffuse scaly erythematous macules with underlying dyspigmentation - Recommend daily broad spectrum sunscreen SPF 30+ to sun-exposed areas, reapply every 2 hours as needed.  - Staying in the shade or wearing long sleeves, sun glasses (UVA+UVB protection) and wide brim hats (4-inch brim around the entire circumference of the hat) are also recommended for sun protection.  - Call for new or changing lesions.  LENTIGINES, SEBORRHEIC KERATOSES, HEMANGIOMAS - Benign normal skin lesions - Benign-appearing - Call for any changes  MELANOCYTIC NEVI -  Tan-brown and/or pink-flesh-colored symmetric macules and papules - Benign appearing on exam today - Observation - Call clinic for new or changing moles - Recommend daily use of broad spectrum spf 30+ sunscreen to sun-exposed areas.   HISTORY OF BASAL CELL CARCINOMA OF THE SKIN - No evidence of recurrence today - Recommend regular full body skin exams - Recommend daily broad spectrum sunscreen SPF 30+ to sun-exposed areas, reapply every 2 hours as needed.  - Call if any new or changing lesions are noted between office visits  History of Dysplastic Nevi - No evidence of recurrence today - Recommend regular full body skin exams - Recommend daily broad spectrum sunscreen SPF 30+ to sun-exposed areas, reapply every 2 hours as needed.  - Call if any new or changing lesions are noted between office visits  HISTORY OF SQUAMOUS CELL CARCINOMA OF THE SKIN - No evidence of recurrence today - No lymphadenopathy - Recommend regular full body skin exams - Recommend daily broad spectrum sunscreen SPF 30+ to sun-exposed areas, reapply every 2 hours as needed.  - Call if any new or changing lesions are noted between office visits   HISTORY OF PRECANCEROUS ACTINIC KERATOSIS - site(s) of PreCancerous Actinic Keratosis clear today. - these may recur and new lesions may form requiring treatment to prevent transformation into skin cancer - observe for new or changing spots and contact [facility] for appointment if occur - photoprotection with sun protective clothing; sunglasses and broad spectrum sunscreen with SPF of at least 30 + and frequent self skin exams recommended - yearly exams by a dermatologist recommended for persons with history of PreCancerous Actinic Keratoses  HISTORY OF MELANOMA -  No evidence of recurrence today at left lower back, 2010 - No lymphadenopathy - Recommend regular full body skin exams - Recommend daily broad spectrum sunscreen SPF 30+ to sun-exposed areas,  reapply every 2 hours as needed.  - Call if any new or changing lesions are noted between office visits      Return in about 1 year (around 07/22/2024) for TBSE, Hx SCCis, Hx MM, Hx BCC, Hx AK, Hx Dysplastic Nevi.  Anise Salvo, RMA, am acting as scribe for Elie Goody, MD .   Documentation: I have reviewed the above documentation for accuracy and completeness, and I agree with the above.  Elie Goody, MD

## 2023-07-23 NOTE — Patient Instructions (Signed)

## 2023-08-22 ENCOUNTER — Encounter: Payer: Self-pay | Admitting: Nurse Practitioner

## 2023-10-02 LAB — CBC WITH DIFFERENTIAL/PLATELET
Basophils Absolute: 0 10*3/uL (ref 0.0–0.2)
Basos: 1 %
EOS (ABSOLUTE): 0.1 10*3/uL (ref 0.0–0.4)
Eos: 2 %
Hematocrit: 43.6 % (ref 34.0–46.6)
Hemoglobin: 14.1 g/dL (ref 11.1–15.9)
Immature Grans (Abs): 0 10*3/uL (ref 0.0–0.1)
Immature Granulocytes: 0 %
Lymphocytes Absolute: 2.2 10*3/uL (ref 0.7–3.1)
Lymphs: 49 %
MCH: 28.7 pg (ref 26.6–33.0)
MCHC: 32.3 g/dL (ref 31.5–35.7)
MCV: 89 fL (ref 79–97)
Monocytes Absolute: 0.3 10*3/uL (ref 0.1–0.9)
Monocytes: 6 %
Neutrophils Absolute: 1.9 10*3/uL (ref 1.4–7.0)
Neutrophils: 42 %
Platelets: 313 10*3/uL (ref 150–450)
RBC: 4.91 x10E6/uL (ref 3.77–5.28)
RDW: 12.4 % (ref 11.7–15.4)
WBC: 4.6 10*3/uL (ref 3.4–10.8)

## 2023-10-02 LAB — CMP14+EGFR
ALT: 17 [IU]/L (ref 0–32)
AST: 16 [IU]/L (ref 0–40)
Albumin: 4 g/dL (ref 3.9–4.9)
Alkaline Phosphatase: 104 [IU]/L (ref 44–121)
BUN/Creatinine Ratio: 24 (ref 12–28)
BUN: 23 mg/dL (ref 8–27)
Bilirubin Total: 0.4 mg/dL (ref 0.0–1.2)
CO2: 24 mmol/L (ref 20–29)
Calcium: 9.5 mg/dL (ref 8.7–10.3)
Chloride: 105 mmol/L (ref 96–106)
Creatinine, Ser: 0.94 mg/dL (ref 0.57–1.00)
Globulin, Total: 2.8 g/dL (ref 1.5–4.5)
Glucose: 95 mg/dL (ref 70–99)
Potassium: 4.4 mmol/L (ref 3.5–5.2)
Sodium: 141 mmol/L (ref 134–144)
Total Protein: 6.8 g/dL (ref 6.0–8.5)
eGFR: 68 mL/min/{1.73_m2} (ref >59)

## 2023-10-02 LAB — LIPID PANEL
Chol/HDL Ratio: 4.5 {ratio} — ABNORMAL HIGH (ref 0.0–4.4)
Cholesterol, Total: 181 mg/dL (ref 100–199)
HDL: 40 mg/dL (ref >39)
LDL Chol Calc (NIH): 121 mg/dL — ABNORMAL HIGH (ref 0–99)
Triglycerides: 110 mg/dL (ref 0–149)
VLDL Cholesterol Cal: 20 mg/dL (ref 5–40)

## 2023-10-02 LAB — IRON,TIBC AND FERRITIN PANEL
Ferritin: 339 ng/mL — ABNORMAL HIGH (ref 15–150)
Iron Saturation: 35 % (ref 15–55)
Iron: 96 ug/dL (ref 27–139)
Total Iron Binding Capacity: 278 ug/dL (ref 250–450)
UIBC: 182 ug/dL (ref 118–369)

## 2023-10-02 LAB — B12 AND FOLATE PANEL
Folate: 8.2 ng/mL (ref >3.0)
Vitamin B-12: 490 pg/mL (ref 232–1245)

## 2023-10-02 LAB — VITAMIN D 25 HYDROXY (VIT D DEFICIENCY, FRACTURES): Vit D, 25-Hydroxy: 18.2 ng/mL — ABNORMAL LOW (ref 30.0–100.0)

## 2023-10-02 LAB — HGB A1C W/O EAG: Hgb A1c MFr Bld: 5.7 % — ABNORMAL HIGH (ref 4.8–5.6)

## 2023-10-02 NOTE — Progress Notes (Signed)
I have reviewed the labs, there are some abnormals but no critical values. We will discuss the lab results at the upcoming visit on 10/27/2023 as previously determined and scheduled.

## 2023-10-27 ENCOUNTER — Ambulatory Visit (INDEPENDENT_AMBULATORY_CARE_PROVIDER_SITE_OTHER): Payer: BC Managed Care – PPO | Admitting: Nurse Practitioner

## 2023-10-27 ENCOUNTER — Encounter: Payer: Self-pay | Admitting: Nurse Practitioner

## 2023-10-27 VITALS — BP 130/88 | HR 76 | Temp 96.1°F | Resp 16 | Ht 66.5 in | Wt 175.0 lb

## 2023-10-27 DIAGNOSIS — Z23 Encounter for immunization: Secondary | ICD-10-CM

## 2023-10-27 DIAGNOSIS — E559 Vitamin D deficiency, unspecified: Secondary | ICD-10-CM | POA: Diagnosis not present

## 2023-10-27 DIAGNOSIS — F419 Anxiety disorder, unspecified: Secondary | ICD-10-CM | POA: Diagnosis not present

## 2023-10-27 DIAGNOSIS — F988 Other specified behavioral and emotional disorders with onset usually occurring in childhood and adolescence: Secondary | ICD-10-CM

## 2023-10-27 DIAGNOSIS — Z0001 Encounter for general adult medical examination with abnormal findings: Secondary | ICD-10-CM

## 2023-10-27 MED ORDER — SERTRALINE HCL 25 MG PO TABS: ORAL_TABLET | ORAL | 1 refills | Status: DC

## 2023-10-27 MED ORDER — AMPHETAMINE-DEXTROAMPHET ER 30 MG PO CP24: 30.0000 mg | ORAL_CAPSULE | Freq: Every day | ORAL | 0 refills | Status: DC

## 2023-10-27 MED ORDER — VITAMIN D (ERGOCALCIFEROL) 1.25 MG (50000 UNIT) PO CAPS: 50000.0000 [IU] | ORAL_CAPSULE | ORAL | 1 refills | Status: DC

## 2023-10-27 NOTE — Progress Notes (Signed)
[facility] [address] [place], [place] [address]  Internal MEDICINE  Office Visit Note  Patient Name: April Wilkins  [id number]  [id number]  Date of Service: 10/27/2023  Chief Complaint  Patient presents with   Hypertension   Annual Exam    Review labs    HPI April Wilkins presents for an annual well visit and physical exam.  Well-appearing 62 y.o. female with hypertension, insomnia, ADD, seasonal allergies  Routine CRC screening: cologuard is due in 2027 Routine mammogram: due for screening mammo in febraury.  DEXA scan: due next year  Pap smear:due this year  Labs: reviewed recent labs. A1c slightly elevated, prediabetic at 5.7 CBC and CMP are normal B12 and folate are normal Vitamin D is low  Ferritin is elevated but the rest of the iron panel is normal.  New or worsening pain: none  Other concerns: requesting flu vaccine today     Current Medication: Outpatient Encounter Medications as of 10/27/2023  Medication Sig   hydrochlorothiazide (HYDRODIURIL) 12.5 MG tablet Take 1 tablet (12.5 mg total) by mouth daily.   meloxicam (MOBIC) 15 MG tablet Take 1 tablet (15 mg total) by mouth daily.   valACYclovir (VALTREX) 1000 MG tablet Take 2 tablets at first sign of symptoms, repeat in 12 hours for 1 day dose   Vitamin D, Ergocalciferol, (DRISDOL) 1.25 MG (50000 UNIT) CAPS capsule Take 1 capsule (50,000 Units total) by mouth every 7 (seven) days.   [DISCONTINUED] amphetamine-dextroamphetamine (ADDERALL XR) 30 MG 24 hr capsule Take 1 capsule (30 mg total) by mouth daily.   [DISCONTINUED] amphetamine-dextroamphetamine (ADDERALL XR) 30 MG 24 hr capsule Take 1 capsule (30 mg total) by mouth daily.   [DISCONTINUED] amphetamine-dextroamphetamine (ADDERALL XR) 30 MG 24 hr capsule Take 1 capsule (30 mg total) by mouth daily.   [DISCONTINUED] sertraline (ZOLOFT) 25 MG tablet TAKE 1 TABLET(25 MG) BY MOUTH DAILY   amphetamine-dextroamphetamine (ADDERALL XR) 30 MG 24 hr  capsule Take 1 capsule (30 mg total) by mouth daily.   [START ON 11/24/2023] amphetamine-dextroamphetamine (ADDERALL XR) 30 MG 24 hr capsule Take 1 capsule (30 mg total) by mouth daily.   [START ON 12/22/2023] amphetamine-dextroamphetamine (ADDERALL XR) 30 MG 24 hr capsule Take 1 capsule (30 mg total) by mouth daily.   sertraline (ZOLOFT) 25 MG tablet TAKE 1 TABLET(25 MG) BY MOUTH DAILY   No facility-administered encounter medications on file as of 10/27/2023.    Surgical History: Past Surgical History:  Procedure Laterality Date   SKIN BIOPSY      Medical History: Past Medical History:  Diagnosis Date   Actinic keratosis 08/31/2010   Right forearm. Pigmented.   Allergy    Seasonal   Basal cell carcinoma (BCC) of right upper arm 02/08/2010   Right deltoid. Superficial.    Chicken pox    Dysplastic nevus 02/08/2010   R mid back bra line. Moderate to severe atypia, close to margin. Excised 03/28/2010, margins free.   Dysplastic nevus 02/08/2010   Left mid to low back above MM site. Moderate atypia, close to margin   Dysplastic nevus 02/08/2010   Left sup. lat. buttocks near side. Moderate atypia, close to margin.   Dysplastic nevus 05/22/2015   Left lower lat. back. Moderat to severe atypia, close to margin. Excised 06/12/2015, margins free.   Dysplastic nevus 03/31/2018   Central upper abdomen. Moderate atypia, limited margins free.   Frequent headaches    Hypertension    Melanoma (HCC) 2010   left lower back  Migraines    Squamous cell carcinoma of skin    back   Squamous cell carcinoma of skin 12/20/2020   L upper arm, SCC IS, [facility] 01/17/21    Family History: Family History  Problem Relation Age of Onset   Hyperlipidemia Father    Heart disease Father    Diabetes Father    Cancer Father        Skin Cancer   Alcohol abuse Paternal Uncle    Hyperlipidemia Paternal Uncle    Heart disease Paternal Uncle    Kidney disease Maternal Grandfather    Hyperlipidemia Paternal  Grandmother    Heart disease Paternal Grandmother    Drug abuse Paternal Grandmother    Breast cancer Maternal Grandmother 19    Social History   Socioeconomic History   Marital status: Single    Spouse name: Not on file   Number of children: Not on file   Years of education: Not on file   Highest education level: Not on file  Occupational History   Not on file  Tobacco Use   Smoking status: Never   Smokeless tobacco: Never  Substance and Sexual Activity   Alcohol use: Yes    Alcohol/week: 0.0 standard drinks of alcohol    Comment: Rare    Drug use: No   Sexual activity: Yes    Partners: Male  Other Topics Concern   Not on file  Social History Narrative   Divorced   Goes by [employer]    Employed- Furniture conservator/restorer    2 children   Caffeine- No coffee, tea and soda daily 24 oz.     Social Drivers of Corporate investment banker Strain: Not on file  Food Insecurity: Not on file  Transportation Needs: Not on file  Physical Activity: Not on file  Stress: Not on file  Social Connections: Not on file  Intimate Partner Violence: Not on file      Review of Systems  Constitutional:  Negative for activity change, appetite change, chills, fatigue, fever and unexpected weight change.  HENT: Negative.  Negative for congestion, ear pain, rhinorrhea, sore throat and trouble swallowing.   Eyes: Negative.   Respiratory: Negative.  Negative for cough, chest tightness, shortness of breath and wheezing.   Cardiovascular: Negative.  Negative for chest pain.  Gastrointestinal: Negative.  Negative for abdominal pain, blood in stool, constipation, diarrhea, nausea and vomiting.  Endocrine: Negative.   Genitourinary: Negative.  Negative for difficulty urinating, dysuria, frequency, hematuria and urgency.  Musculoskeletal: Negative.  Negative for arthralgias, back pain, joint swelling, myalgias and neck pain.  Skin: Negative.  Negative for rash and wound.   Allergic/Immunologic: Negative.  Negative for immunocompromised state.  Neurological: Negative.  Negative for dizziness, seizures, numbness and headaches.  Hematological: Negative.   Psychiatric/Behavioral: Negative.  Negative for behavioral problems, self-injury and suicidal ideas. The patient is not nervous/anxious.     Vital Signs: BP 130/88   Pulse 76   Temp (!) 96.1 F (35.6 C)   Resp 16   Ht 5' 6.5" (1.689 m)   Wt 175 lb (79.4 kg)   SpO2 95%   BMI 27.82 kg/m    Physical Exam Vitals reviewed.  Constitutional:      General: She is awake. She is not in acute distress.    Appearance: Normal appearance. She is well-developed, well-groomed and overweight. She is not ill-appearing or diaphoretic.  HENT:     Head: Normocephalic and atraumatic.  Right Ear: Tympanic membrane, ear canal and external ear normal.     Left Ear: Tympanic membrane, ear canal and external ear normal.     Nose: Nose normal. No congestion or rhinorrhea.     Mouth/Throat:     Lips: Pink.     Mouth: Mucous membranes are moist.     Pharynx: Oropharynx is clear. Uvula midline. No oropharyngeal exudate or posterior oropharyngeal erythema.  Eyes:     General: Lids are normal. Vision grossly intact. Gaze aligned appropriately. No scleral icterus.       Right eye: No discharge.        Left eye: No discharge.     Conjunctiva/sclera: Conjunctivae normal.     Pupils: Pupils are equal, round, and reactive to light.     Funduscopic exam:    Right eye: Red reflex present.        Left eye: Red reflex present. Neck:     Thyroid: No thyromegaly.     Vascular: No JVD.     Trachea: Trachea and phonation normal. No tracheal deviation.  Cardiovascular:     Rate and Rhythm: Normal rate and regular rhythm.     Pulses: Normal pulses.     Heart sounds: Normal heart sounds, S1 normal and S2 normal. No murmur heard.    No friction rub. No gallop.  Pulmonary:     Effort: Pulmonary effort is normal. No accessory  muscle usage or respiratory distress.     Breath sounds: Normal breath sounds and air entry. No stridor. No wheezing or rales.  Chest:     Chest wall: No tenderness.     Comments: Declined clinical breast exam, gets annual mammograms Abdominal:     General: Bowel sounds are normal. There is no distension.     Palpations: Abdomen is soft. There is no shifting dullness, fluid wave, mass or pulsatile mass.     Tenderness: There is no abdominal tenderness. There is no guarding or rebound.  Musculoskeletal:        General: No tenderness or deformity. Normal range of motion.     Cervical back: Normal range of motion and neck supple.     Right lower leg: No edema.     Left lower leg: No edema.  Lymphadenopathy:     Cervical: No cervical adenopathy.  Skin:    General: Skin is warm and dry.     Capillary Refill: Capillary refill takes less than 2 seconds.     Coloration: Skin is not pale.     Findings: No erythema or rash.  Neurological:     Mental Status: She is alert and oriented to person, place, and time.     Cranial Nerves: No cranial nerve deficit.     Motor: No abnormal muscle tone.     Coordination: Coordination normal.     Gait: Gait normal.     Deep Tendon Reflexes: Reflexes are normal and symmetric.  Psychiatric:        Mood and Affect: Mood normal.        Behavior: Behavior normal. Behavior is cooperative.        Thought Content: Thought content normal.        Judgment: Judgment normal.        Assessment/Plan: 1. Encounter for routine adult health examination with abnormal findings Age-appropriate preventive screenings and vaccinations discussed, annual physical exam completed. Routine labs for health maintenance results are reviewed with the patient. PHM updated.    2. Vitamin D deficiency  Start weekly vitamin D supplement as prescribed. - Vitamin D, Ergocalciferol, (DRISDOL) 1.25 MG (50000 UNIT) CAPS capsule; Take 1 capsule (50,000 Units total) by mouth every 7  (seven) days.  Dispense: 12 capsule; Refill: 1  3. Attention deficit disorder (ADD) in adult Continue adderall XR as prescribed. Follow up in 3 months for additional refills. UDS due at next office visit  - amphetamine-dextroamphetamine (ADDERALL XR) 30 MG 24 hr capsule; Take 1 capsule (30 mg total) by mouth daily.  Dispense: 30 capsule; Refill: 0 - amphetamine-dextroamphetamine (ADDERALL XR) 30 MG 24 hr capsule; Take 1 capsule (30 mg total) by mouth daily.  Dispense: 30 capsule; Refill: 0 - amphetamine-dextroamphetamine (ADDERALL XR) 30 MG 24 hr capsule; Take 1 capsule (30 mg total) by mouth daily.  Dispense: 30 capsule; Refill: 0  4. Anxiety Continue sertraline as prescribed.  - sertraline (ZOLOFT) 25 MG tablet; TAKE 1 TABLET(25 MG) BY MOUTH DAILY  Dispense: 90 tablet; Refill: 1  5. Needs flu shot (Primary) Flu vaccine administered in office  - Influenza, MDCK, trivalent, PF(Flucelvax egg-free)    General Counseling: Sherrlyn Hock understanding of the findings of todays visit and agrees with plan of treatment. I have discussed any further diagnostic evaluation that may be needed or ordered today. We also reviewed her medications today. she has been encouraged to call the office with any questions or concerns that should arise related to todays visit.    Orders Placed This Encounter  Procedures   Influenza, MDCK, trivalent, PF(Flucelvax egg-free)    Meds ordered this encounter  Medications   Vitamin D, Ergocalciferol, (DRISDOL) 1.25 MG (50000 UNIT) CAPS capsule    Sig: Take 1 capsule (50,000 Units total) by mouth every 7 (seven) days.    Dispense:  12 capsule    Refill:  1    Fill new script today   sertraline (ZOLOFT) 25 MG tablet    Sig: TAKE 1 TABLET(25 MG) BY MOUTH DAILY    Dispense:  90 tablet    Refill:  1    For future refills   amphetamine-dextroamphetamine (ADDERALL XR) 30 MG 24 hr capsule    Sig: Take 1 capsule (30 mg total) by mouth daily.    Dispense:  30  capsule    Refill:  0    Fill for december   amphetamine-dextroamphetamine (ADDERALL XR) 30 MG 24 hr capsule    Sig: Take 1 capsule (30 mg total) by mouth daily.    Dispense:  30 capsule    Refill:  0    Fill for January   amphetamine-dextroamphetamine (ADDERALL XR) 30 MG 24 hr capsule    Sig: Take 1 capsule (30 mg total) by mouth daily.    Dispense:  30 capsule    Refill:  0    Fill for february    Return in about 3 months (around 01/21/2024) for F/U, ADHD med check, Destine Zirkle PCP, UDS due at next office visit. .   Total time spent:30 Minutes Time spent includes review of chart, medications, test results, and follow up plan with the patient.   [place] Controlled Substance Database was reviewed by me.  This patient was seen by Sallyanne Kuster, FNP-C in collaboration with Dr. Beverely Risen as a part of collaborative care agreement.  Uchenna Rappaport R. Tedd Sias, MSN, FNP-C Internal medicine

## 2024-01-07 ENCOUNTER — Other Ambulatory Visit: Payer: Self-pay | Admitting: Nurse Practitioner

## 2024-01-07 DIAGNOSIS — Z1231 Encounter for screening mammogram for malignant neoplasm of breast: Secondary | ICD-10-CM

## 2024-01-15 ENCOUNTER — Ambulatory Visit: Payer: BC Managed Care – PPO

## 2024-01-22 ENCOUNTER — Ambulatory Visit

## 2024-01-26 ENCOUNTER — Ambulatory Visit: Payer: BC Managed Care – PPO | Admitting: Nurse Practitioner

## 2024-02-02 ENCOUNTER — Ambulatory Visit: Payer: BC Managed Care – PPO | Admitting: Nurse Practitioner

## 2024-02-11 ENCOUNTER — Ambulatory Visit
Admission: RE | Admit: 2024-02-11 | Discharge: 2024-02-11 | Disposition: A | Discharge status: Home / Self Care | Source: Ambulatory Visit | Attending: Nurse Practitioner | Admitting: Nurse Practitioner

## 2024-02-11 DIAGNOSIS — Z1231 Encounter for screening mammogram for malignant neoplasm of breast: Secondary | ICD-10-CM | POA: Insufficient documentation

## 2024-02-17 ENCOUNTER — Ambulatory Visit: Admitting: Nurse Practitioner

## 2024-02-17 ENCOUNTER — Encounter: Payer: Self-pay | Admitting: Nurse Practitioner

## 2024-02-17 VITALS — BP 120/84 | HR 86 | Temp 98.3°F | Resp 16 | Ht 66.5 in | Wt 166.0 lb

## 2024-02-17 DIAGNOSIS — R1314 Dysphagia, pharyngoesophageal phase: Secondary | ICD-10-CM

## 2024-02-17 DIAGNOSIS — Z79899 Other long term (current) drug therapy: Secondary | ICD-10-CM

## 2024-02-17 DIAGNOSIS — B009 Herpesviral infection, unspecified: Secondary | ICD-10-CM

## 2024-02-17 DIAGNOSIS — F988 Other specified behavioral and emotional disorders with onset usually occurring in childhood and adolescence: Secondary | ICD-10-CM | POA: Diagnosis not present

## 2024-02-17 DIAGNOSIS — F419 Anxiety disorder, unspecified: Secondary | ICD-10-CM

## 2024-02-17 LAB — POCT URINE DRUG SCREEN
Methylenedioxyamphetamine: NOT DETECTED
POC Amphetamine UR: POSITIVE — AB
POC BENZODIAZEPINES UR: NOT DETECTED
POC Barbiturate UR: NOT DETECTED
POC Cocaine UR: NOT DETECTED
POC Ecstasy UR: NOT DETECTED
POC Marijuana UR: NOT DETECTED
POC Methadone UR: NOT DETECTED
POC Methamphetamine UR: NOT DETECTED
POC Opiate Ur: NOT DETECTED
POC Oxycodone UR: NOT DETECTED
POC PHENCYCLIDINE UR: NOT DETECTED
POC TRICYCLICS UR: NOT DETECTED

## 2024-02-17 MED ORDER — AMPHETAMINE-DEXTROAMPHET ER 30 MG PO CP24: 30.0000 mg | ORAL_CAPSULE | Freq: Every day | ORAL | 0 refills | Status: DC

## 2024-02-17 MED ORDER — VALACYCLOVIR HCL 1 G PO TABS: ORAL_TABLET | ORAL | 11 refills | Status: AC

## 2024-02-17 NOTE — Progress Notes (Signed)
 [facility] [place] [facility] [address] [place], [place] [address]  Internal MEDICINE  Office Visit Note  Patient Name: April Wilkins  [id number]  [id number]  Date of Service: 02/17/2024  Chief Complaint  Patient presents with   Hypertension   Follow-up    HPI Legacie presents for a follow-up visit for ADHD, fever blisters and swallowing issues ADHD -- current dose is effective. BP and heart rate are normal. Denies any palpitations or other adverse side effects.  Esophageal dysphagia -- food gets caught or hung, feels like it won't go down and then will have to throw it up. This happens sometimes but not all the time.  Fever blisters -- takes valtrex as needed for outbreaks     Current Medication: Outpatient Encounter Medications as of 02/17/2024  Medication Sig   hydrochlorothiazide (HYDRODIURIL) 12.5 MG tablet Take 1 tablet (12.5 mg total) by mouth daily.   meloxicam (MOBIC) 15 MG tablet Take 1 tablet (15 mg total) by mouth daily.   Vitamin D, Ergocalciferol, (DRISDOL) 1.25 MG (50000 UNIT) CAPS capsule Take 1 capsule (50,000 Units total) by mouth every 7 (seven) days.   [DISCONTINUED] amphetamine-dextroamphetamine (ADDERALL XR) 30 MG 24 hr capsule Take 1 capsule (30 mg total) by mouth daily.   [DISCONTINUED] amphetamine-dextroamphetamine (ADDERALL XR) 30 MG 24 hr capsule Take 1 capsule (30 mg total) by mouth daily.   [DISCONTINUED] amphetamine-dextroamphetamine (ADDERALL XR) 30 MG 24 hr capsule Take 1 capsule (30 mg total) by mouth daily.   [DISCONTINUED] sertraline (ZOLOFT) 25 MG tablet TAKE 1 TABLET(25 MG) BY MOUTH DAILY   [DISCONTINUED] valACYclovir (VALTREX) 1000 MG tablet Take 2 tablets at first sign of symptoms, repeat in 12 hours for 1 day dose   [START ON 04/13/2024] amphetamine-dextroamphetamine (ADDERALL XR) 30 MG 24 hr capsule Take 1 capsule (30 mg total) by mouth daily.   [START ON 03/16/2024] amphetamine-dextroamphetamine (ADDERALL XR) 30 MG 24 hr capsule Take 1 capsule  (30 mg total) by mouth daily.   amphetamine-dextroamphetamine (ADDERALL XR) 30 MG 24 hr capsule Take 1 capsule (30 mg total) by mouth daily.   valACYclovir (VALTREX) 1000 MG tablet Take 1 tablet by mouth twice daily starting at first sign of symptoms and continue until resolved, repeat as needed for outbreaks.   No facility-administered encounter medications on file as of 02/17/2024.    Surgical History: Past Surgical History:  Procedure Laterality Date   SKIN BIOPSY      Medical History: Past Medical History:  Diagnosis Date   Actinic keratosis 08/31/2010   Right forearm. Pigmented.   Allergy    Seasonal   Basal cell carcinoma (BCC) of right upper arm 02/08/2010   Right deltoid. Superficial.    Chicken pox    Dysplastic nevus 02/08/2010   R mid back bra line. Moderate to severe atypia, close to margin. Excised 03/28/2010, margins free.   Dysplastic nevus 02/08/2010   Left mid to low back above MM site. Moderate atypia, close to margin   Dysplastic nevus 02/08/2010   Left sup. lat. buttocks near side. Moderate atypia, close to margin.   Dysplastic nevus 05/22/2015   Left lower lat. back. Moderat to severe atypia, close to margin. Excised 06/12/2015, margins free.   Dysplastic nevus 03/31/2018   Central upper abdomen. Moderate atypia, limited margins free.   Frequent headaches    Hypertension    Melanoma (HCC) 2010   left lower back   Migraines    Squamous cell carcinoma of skin    back   Squamous  cell carcinoma of skin 12/20/2020   L upper arm, SCC IS, [facility] 01/17/21    Family History: Family History  Problem Relation Age of Onset   Hyperlipidemia Father    Heart disease Father    Diabetes Father    Cancer Father        Skin Cancer   Alcohol abuse Paternal Uncle    Hyperlipidemia Paternal Uncle    Heart disease Paternal Uncle    Kidney disease Maternal Grandfather    Hyperlipidemia Paternal Grandmother    Heart disease Paternal Grandmother    Drug abuse Paternal  Grandmother    Breast cancer Maternal Grandmother 35    Social History   Socioeconomic History   Marital status: Single    Spouse name: Not on file   Number of children: Not on file   Years of education: Not on file   Highest education level: Not on file  Occupational History   Not on file  Tobacco Use   Smoking status: Never   Smokeless tobacco: Never  Substance and Sexual Activity   Alcohol use: Yes    Alcohol/week: 0.0 standard drinks of alcohol    Comment: Rare    Drug use: No   Sexual activity: Yes    Partners: Male  Other Topics Concern   Not on file  Social History Narrative   Divorced   Goes by [employer]    Employed- Furniture conservator/restorer    2 children   Caffeine- No coffee, tea and soda daily 24 oz.     Social Drivers of Corporate investment banker Strain: Not on file  Food Insecurity: Not on file  Transportation Needs: Not on file  Physical Activity: Not on file  Stress: Not on file  Social Connections: Not on file  Intimate Partner Violence: Not on file      Review of Systems  Constitutional:  Negative for chills, fatigue and unexpected weight change.  HENT:  Positive for postnasal drip and trouble swallowing. Negative for congestion, rhinorrhea, sneezing and sore throat.   Eyes:  Negative for redness.  Respiratory:  Negative for cough, chest tightness, shortness of breath and wheezing.   Cardiovascular: Negative.  Negative for chest pain and palpitations.  Gastrointestinal: Negative.  Negative for abdominal pain, constipation, diarrhea, nausea and vomiting.  Genitourinary: Negative.  Negative for dysuria and frequency.  Musculoskeletal: Negative.  Negative for arthralgias, back pain, joint swelling and neck pain.  Skin:  Negative for rash.  Neurological: Negative.  Negative for tremors and numbness.  Hematological:  Negative for adenopathy. Does not bruise/bleed easily.  Psychiatric/Behavioral:  Negative for behavioral problems  (Depression), sleep disturbance and suicidal ideas. The patient is not nervous/anxious.     Vital Signs: BP 120/84   Pulse 86   Temp 98.3 F (36.8 C)   Resp 16   Ht 5' 6.5" (1.689 m)   Wt 166 lb (75.3 kg)   SpO2 98%   BMI 26.39 kg/m    Physical Exam Vitals reviewed.  Constitutional:      General: She is not in acute distress.    Appearance: Normal appearance. She is not ill-appearing.  HENT:     Head: Normocephalic and atraumatic.  Eyes:     Pupils: Pupils are equal, round, and reactive to light.  Cardiovascular:     Rate and Rhythm: Normal rate and regular rhythm.  Pulmonary:     Effort: Pulmonary effort is normal. No respiratory distress.  Neurological:  Mental Status: She is alert and oriented to person, place, and time.  Psychiatric:        Mood and Affect: Mood normal.        Behavior: Behavior normal.        Assessment/Plan: 1. Pharyngoesophageal dysphagia (Primary) Patient will call the clinic if this gets worse and request further evaluation when she is ready for it.   2. Herpes simplex Continue valtrex for outbreaks of fever blisters.  - valACYclovir (VALTREX) 1000 MG tablet; Take 1 tablet by mouth twice daily starting at first sign of symptoms and continue until resolved, repeat as needed for outbreaks.  Dispense: 30 tablet; Refill: 11  3. Encounter for long-term (current) use of high-risk medication UDS done in office today. Positive for amphetamine which is consistent with her current prescriptions - POCT Urine Drug Screen  4. Attention deficit disorder (ADD) in adult Continue adderall as prescribed. Follow up in 3 months for additional refills. UDS due in 6 months. - amphetamine-dextroamphetamine (ADDERALL XR) 30 MG 24 hr capsule; Take 1 capsule (30 mg total) by mouth daily.  Dispense: 30 capsule; Refill: 0 - amphetamine-dextroamphetamine (ADDERALL XR) 30 MG 24 hr capsule; Take 1 capsule (30 mg total) by mouth daily.  Dispense: 30 capsule;  Refill: 0 - amphetamine-dextroamphetamine (ADDERALL XR) 30 MG 24 hr capsule; Take 1 capsule (30 mg total) by mouth daily.  Dispense: 30 capsule; Refill: 0  5. Anxiety Manageable without medication at this time.    General Counseling: walterine amodei understanding of the findings of todays visit and agrees with plan of treatment. I have discussed any further diagnostic evaluation that may be needed or ordered today. We also reviewed her medications today. she has been encouraged to call the office with any questions or concerns that should arise related to todays visit.    Orders Placed This Encounter  Procedures   POCT Urine Drug Screen    Meds ordered this encounter  Medications   amphetamine-dextroamphetamine (ADDERALL XR) 30 MG 24 hr capsule    Sig: Take 1 capsule (30 mg total) by mouth daily.    Dispense:  30 capsule    Refill:  0    Fill for june   amphetamine-dextroamphetamine (ADDERALL XR) 30 MG 24 hr capsule    Sig: Take 1 capsule (30 mg total) by mouth daily.    Dispense:  30 capsule    Refill:  0    Fill for may   amphetamine-dextroamphetamine (ADDERALL XR) 30 MG 24 hr capsule    Sig: Take 1 capsule (30 mg total) by mouth daily.    Dispense:  30 capsule    Refill:  0    Fill for april   valACYclovir (VALTREX) 1000 MG tablet    Sig: Take 1 tablet by mouth twice daily starting at first sign of symptoms and continue until resolved, repeat as needed for outbreaks.    Dispense:  30 tablet    Refill:  11    Return in about 3 months (around 05/12/2024) for F/U, ADHD med check, Francetta Ilg PCP.   Total time spent:30 Minutes Time spent includes review of chart, medications, test results, and follow up plan with the patient.   [place] Controlled Substance Database was reviewed by me.  This patient was seen by Sallyanne Kuster, FNP-C in collaboration with Dr. Beverely Risen as a part of collaborative care agreement.   Artavia Jeanlouis R. Tedd Sias, MSN, FNP-C Internal medicine

## 2024-03-02 ENCOUNTER — Encounter: Payer: Self-pay | Admitting: Nurse Practitioner

## 2024-04-09 ENCOUNTER — Other Ambulatory Visit: Payer: Self-pay | Admitting: Nurse Practitioner

## 2024-04-09 DIAGNOSIS — E559 Vitamin D deficiency, unspecified: Secondary | ICD-10-CM

## 2024-05-06 ENCOUNTER — Other Ambulatory Visit: Payer: Self-pay | Admitting: Nurse Practitioner

## 2024-05-06 DIAGNOSIS — R609 Edema, unspecified: Secondary | ICD-10-CM

## 2024-05-06 DIAGNOSIS — I10 Essential (primary) hypertension: Secondary | ICD-10-CM

## 2024-05-18 ENCOUNTER — Ambulatory Visit: Admitting: Nurse Practitioner

## 2024-05-24 ENCOUNTER — Encounter: Payer: Self-pay | Admitting: Nurse Practitioner

## 2024-05-24 ENCOUNTER — Ambulatory Visit: Admitting: Nurse Practitioner

## 2024-05-24 VITALS — BP 118/76 | HR 99 | Temp 98.0°F | Resp 16 | Ht 66.5 in | Wt 167.2 lb

## 2024-05-24 DIAGNOSIS — E782 Mixed hyperlipidemia: Secondary | ICD-10-CM

## 2024-05-24 DIAGNOSIS — E538 Deficiency of other specified B group vitamins: Secondary | ICD-10-CM

## 2024-05-24 DIAGNOSIS — R7303 Prediabetes: Secondary | ICD-10-CM

## 2024-05-24 DIAGNOSIS — R7989 Other specified abnormal findings of blood chemistry: Secondary | ICD-10-CM

## 2024-05-24 DIAGNOSIS — E559 Vitamin D deficiency, unspecified: Secondary | ICD-10-CM | POA: Diagnosis not present

## 2024-05-24 DIAGNOSIS — F988 Other specified behavioral and emotional disorders with onset usually occurring in childhood and adolescence: Secondary | ICD-10-CM

## 2024-05-24 MED ORDER — AMPHETAMINE-DEXTROAMPHET ER 30 MG PO CP24: 30.0000 mg | ORAL_CAPSULE | Freq: Every day | ORAL | 0 refills | Status: DC

## 2024-05-24 NOTE — Progress Notes (Signed)
 [facility] [address] [place], [place] [address]  Internal MEDICINE  Office Visit Note  Patient Name: April Wilkins  [id number]  [id number]  Date of Service: 05/24/2024  Chief Complaint  Patient presents with   Hypertension   Follow-up   HPI April Wilkins presents for a follow-up visit for ADHD, lab orders and medication refills  ADHD -- current dose remains effective. BP and heart rate are normal. She denies any palpitations or other adverse side effects.  Due for routine labs for upcoming CPE in December.  Hypertension -- takes hydrochlorothiazide  daily Prediabetes -- has been limiting carbs and sugars. Checking A1c 1-2 times a year.    Current Medication: Outpatient Encounter Medications as of 05/24/2024  Medication Sig   amphetamine -dextroamphetamine  (ADDERALL  XR) 30 MG 24 hr capsule Take 1 capsule (30 mg total) by mouth daily.   [START ON 06/21/2024] amphetamine -dextroamphetamine  (ADDERALL  XR) 30 MG 24 hr capsule Take 1 capsule (30 mg total) by mouth daily.   [START ON 07/19/2024] amphetamine -dextroamphetamine  (ADDERALL  XR) 30 MG 24 hr capsule Take 1 capsule (30 mg total) by mouth daily.   hydrochlorothiazide  (HYDRODIURIL ) 12.5 MG tablet TAKE 1 TABLET(12.5 MG) BY MOUTH DAILY   meloxicam  (MOBIC ) 15 MG tablet Take 1 tablet (15 mg total) by mouth daily.   valACYclovir  (VALTREX ) 1000 MG tablet Take 1 tablet by mouth twice daily starting at first sign of symptoms and continue until resolved, repeat as needed for outbreaks.   Vitamin D , Ergocalciferol , (DRISDOL ) 1.25 MG (50000 UNIT) CAPS capsule TAKE 1 CAPSULE BY MOUTH EVERY 7 DAYS   [DISCONTINUED] amphetamine -dextroamphetamine  (ADDERALL  XR) 30 MG 24 hr capsule Take 1 capsule (30 mg total) by mouth daily.   [DISCONTINUED] amphetamine -dextroamphetamine  (ADDERALL  XR) 30 MG 24 hr capsule Take 1 capsule (30 mg total) by mouth daily.   [DISCONTINUED] amphetamine -dextroamphetamine  (ADDERALL  XR) 30 MG 24 hr capsule Take 1 capsule  (30 mg total) by mouth daily.   No facility-administered encounter medications on file as of 05/24/2024.    Surgical History: Past Surgical History:  Procedure Laterality Date   SKIN BIOPSY      Medical History: Past Medical History:  Diagnosis Date   Actinic keratosis 08/31/2010   Right forearm. Pigmented.   Allergy    Seasonal   Basal cell carcinoma (BCC) of right upper arm 02/08/2010   Right deltoid. Superficial.    Chicken pox    Dysplastic nevus 02/08/2010   R mid back bra line. Moderate to severe atypia, close to margin. Excised 03/28/2010, margins free.   Dysplastic nevus 02/08/2010   Left mid to low back above MM site. Moderate atypia, close to margin   Dysplastic nevus 02/08/2010   Left sup. lat. buttocks near side. Moderate atypia, close to margin.   Dysplastic nevus 05/22/2015   Left lower lat. back. Moderat to severe atypia, close to margin. Excised 06/12/2015, margins free.   Dysplastic nevus 03/31/2018   Central upper abdomen. Moderate atypia, limited margins free.   Frequent headaches    Hypertension    Melanoma (HCC) 2010   left lower back   Migraines    Squamous cell carcinoma of skin    back   Squamous cell carcinoma of skin 12/20/2020   L upper arm, SCC IS, EDC 01/17/21    Family History: Family History  Problem Relation Age of Onset   Hyperlipidemia Father    Heart disease Father    Diabetes Father    Cancer Father        Skin Cancer  Alcohol abuse Paternal Uncle    Hyperlipidemia Paternal Uncle    Heart disease Paternal Uncle    Kidney disease Maternal Grandfather    Hyperlipidemia Paternal Grandmother    Heart disease Paternal Grandmother    Drug abuse Paternal Grandmother    Breast cancer Maternal Grandmother 8    Social History   Socioeconomic History   Marital status: Single    Spouse name: Not on file   Number of children: Not on file   Years of education: Not on file   Highest education level: Not on file  Occupational  History   Not on file  Tobacco Use   Smoking status: Never   Smokeless tobacco: Never  Substance and Sexual Activity   Alcohol use: Yes    Alcohol/week: 0.0 standard drinks of alcohol    Comment: Rare    Drug use: No   Sexual activity: Yes    Partners: Male  Other Topics Concern   Not on file  Social History Narrative   Divorced   Goes by [employer]    Employed- Furniture conservator/restorer    2 children   Caffeine- No coffee, tea and soda daily 24 oz.     Social Drivers of Corporate investment banker Strain: Not on file  Food Insecurity: Not on file  Transportation Needs: Not on file  Physical Activity: Not on file  Stress: Not on file  Social Connections: Not on file  Intimate Partner Violence: Not on file      Review of Systems  Constitutional:  Negative for chills, fatigue and unexpected weight change.  HENT:  Positive for postnasal drip. Negative for congestion, rhinorrhea, sneezing, sore throat and trouble swallowing.   Eyes:  Negative for redness.  Respiratory:  Negative for cough, chest tightness, shortness of breath and wheezing.   Cardiovascular: Negative.  Negative for chest pain and palpitations.  Gastrointestinal: Negative.  Negative for abdominal pain, constipation, diarrhea, nausea and vomiting.  Genitourinary: Negative.  Negative for dysuria and frequency.  Musculoskeletal: Negative.  Negative for arthralgias, back pain, joint swelling and neck pain.  Skin:  Negative for rash.  Neurological: Negative.  Negative for tremors and numbness.  Hematological:  Negative for adenopathy. Does not bruise/bleed easily.  Psychiatric/Behavioral:  Positive for decreased concentration. Negative for behavioral problems (Depression), sleep disturbance and suicidal ideas. The patient is not nervous/anxious.     Vital Signs: BP 118/76   Pulse 99   Temp 98 F (36.7 C)   Resp 16   Ht 5' 6.5 (1.689 m)   Wt 167 lb 3.2 oz (75.8 kg)   SpO2 96%   BMI 26.58 kg/m     Physical Exam Vitals reviewed.  Constitutional:      General: She is not in acute distress.    Appearance: Normal appearance. She is not ill-appearing.  HENT:     Head: Normocephalic and atraumatic.  Eyes:     Pupils: Pupils are equal, round, and reactive to light.  Cardiovascular:     Rate and Rhythm: Normal rate and regular rhythm.  Pulmonary:     Effort: Pulmonary effort is normal. No respiratory distress.  Neurological:     Mental Status: She is alert and oriented to person, place, and time.  Psychiatric:        Mood and Affect: Mood normal.        Behavior: Behavior normal.        Assessment/Plan: 1. Prediabetes (Primary) Routine labs ordered  -  CBC with Differential/Platelet - CMP14+EGFR - Lipid Profile - B12 and Folate Panel - Vitamin D  (25 hydroxy) - Hgb A1C w/o eAG  2. Mixed hyperlipidemia Routine labs ordered  - CBC with Differential/Platelet - CMP14+EGFR - Lipid Profile - B12 and Folate Panel - Vitamin D  (25 hydroxy) - Hgb A1C w/o eAG  3. B12 deficiency Routine labs ordered  - CBC with Differential/Platelet - CMP14+EGFR - Lipid Profile - B12 and Folate Panel - Vitamin D  (25 hydroxy) - Hgb A1C w/o eAG  4. Vitamin D  deficiency Routine labs ordered  - CBC with Differential/Platelet - CMP14+EGFR - Lipid Profile - B12 and Folate Panel - Vitamin D  (25 hydroxy) - Hgb A1C w/o eAG  5. Elevated ferritin level Routine labs ordered.  - CBC with Differential/Platelet - CMP14+EGFR - Lipid Profile - B12 and Folate Panel - Vitamin D  (25 hydroxy) - Hgb A1C w/o eAG  6. Attention deficit disorder (ADD) in adult Continue adderall  as prescribed. Follow up in 3 months for additional refills.  - CBC with Differential/Platelet - CMP14+EGFR - Lipid Profile - B12 and Folate Panel - Vitamin D  (25 hydroxy) - Hgb A1C w/o eAG - amphetamine -dextroamphetamine  (ADDERALL  XR) 30 MG 24 hr capsule; Take 1 capsule (30 mg total) by mouth daily.  Dispense: 30  capsule; Refill: 0 - amphetamine -dextroamphetamine  (ADDERALL  XR) 30 MG 24 hr capsule; Take 1 capsule (30 mg total) by mouth daily.  Dispense: 30 capsule; Refill: 0 - amphetamine -dextroamphetamine  (ADDERALL  XR) 30 MG 24 hr capsule; Take 1 capsule (30 mg total) by mouth daily.  Dispense: 30 capsule; Refill: 0   General Counseling: April Wilkins verbalizes understanding of the findings of todays visit and agrees with plan of treatment. I have discussed any further diagnostic evaluation that may be needed or ordered today. We also reviewed her medications today. she has been encouraged to call the office with any questions or concerns that should arise related to todays visit.    Orders Placed This Encounter  Procedures   CBC with Differential/Platelet   CMP14+EGFR   Lipid Profile   B12 and Folate Panel   Vitamin D  (25 hydroxy)   Hgb A1C w/o eAG    Meds ordered this encounter  Medications   amphetamine -dextroamphetamine  (ADDERALL  XR) 30 MG 24 hr capsule    Sig: Take 1 capsule (30 mg total) by mouth daily.    Dispense:  30 capsule    Refill:  0    Fill for july   amphetamine -dextroamphetamine  (ADDERALL  XR) 30 MG 24 hr capsule    Sig: Take 1 capsule (30 mg total) by mouth daily.    Dispense:  30 capsule    Refill:  0    Fill for august   amphetamine -dextroamphetamine  (ADDERALL  XR) 30 MG 24 hr capsule    Sig: Take 1 capsule (30 mg total) by mouth daily.    Dispense:  30 capsule    Refill:  0    Fill for september    Return in about 3 months (around 08/17/2024) for F/U, ADHD med check, Massey Ruhland PCP.   Total time spent:30 Minutes Time spent includes review of chart, medications, test results, and follow up plan with the patient.   [place] Controlled Substance Database was reviewed by me.  This patient was seen by Mardy Maxin, FNP-C in collaboration with Dr. Sigrid Bathe as a part of collaborative care agreement.   Ellyssa Zagal R. Maxin, MSN, FNP-C Internal medicine

## 2024-05-29 ENCOUNTER — Encounter: Payer: Self-pay | Admitting: Nurse Practitioner

## 2024-07-26 ENCOUNTER — Encounter: Payer: BC Managed Care – PPO | Admitting: Dermatology

## 2024-08-03 ENCOUNTER — Ambulatory Visit (INDEPENDENT_AMBULATORY_CARE_PROVIDER_SITE_OTHER): Admitting: Dermatology

## 2024-08-03 ENCOUNTER — Encounter: Payer: Self-pay | Admitting: Dermatology

## 2024-08-03 DIAGNOSIS — Z85828 Personal history of other malignant neoplasm of skin: Secondary | ICD-10-CM

## 2024-08-03 DIAGNOSIS — W908XXA Exposure to other nonionizing radiation, initial encounter: Secondary | ICD-10-CM | POA: Diagnosis not present

## 2024-08-03 DIAGNOSIS — Z872 Personal history of diseases of the skin and subcutaneous tissue: Secondary | ICD-10-CM

## 2024-08-03 DIAGNOSIS — L821 Other seborrheic keratosis: Secondary | ICD-10-CM

## 2024-08-03 DIAGNOSIS — L814 Other melanin hyperpigmentation: Secondary | ICD-10-CM | POA: Diagnosis not present

## 2024-08-03 DIAGNOSIS — D1801 Hemangioma of skin and subcutaneous tissue: Secondary | ICD-10-CM

## 2024-08-03 DIAGNOSIS — D229 Melanocytic nevi, unspecified: Secondary | ICD-10-CM

## 2024-08-03 DIAGNOSIS — L578 Other skin changes due to chronic exposure to nonionizing radiation: Secondary | ICD-10-CM

## 2024-08-03 DIAGNOSIS — Z85820 Personal history of malignant melanoma of skin: Secondary | ICD-10-CM

## 2024-08-03 DIAGNOSIS — Z1283 Encounter for screening for malignant neoplasm of skin: Secondary | ICD-10-CM

## 2024-08-03 DIAGNOSIS — Z86018 Personal history of other benign neoplasm: Secondary | ICD-10-CM

## 2024-08-03 NOTE — Patient Instructions (Signed)

## 2024-08-03 NOTE — Progress Notes (Signed)
 Follow-Up Visit   Subjective  April Wilkins is a 63 y.o. female who presents for the following: Skin Cancer Screening and Full Body Skin Exam  The patient presents for Total-Body Skin Exam (TBSE) for skin cancer screening and mole check. The patient has spots, moles and lesions to be evaluated, some may be new or changing and the patient may have concern these could be cancer.  Hx MM, SCC, BCC, DN.   The following portions of the chart were reviewed this encounter and updated as appropriate: medications, allergies, medical history  Review of Systems:  No other skin or systemic complaints except as noted in HPI or Assessment and Plan.  Objective  Well appearing patient in no apparent distress; mood and affect are within normal limits.  A full examination was performed including scalp, head, eyes, ears, nose, lips, neck, chest, axillae, abdomen, back, buttocks, bilateral upper extremities, bilateral lower extremities, hands, feet, fingers, toes, fingernails, and toenails. All findings within normal limits unless otherwise noted below.   Relevant physical exam findings are noted in the Assessment and Plan.    Assessment & Plan   SKIN CANCER SCREENING PERFORMED TODAY.  ACTINIC DAMAGE - Chronic condition, secondary to cumulative UV/sun exposure - diffuse scaly erythematous macules with underlying dyspigmentation - Recommend daily broad spectrum sunscreen SPF 30+ to sun-exposed areas, reapply every 2 hours as needed.  - Staying in the shade or wearing long sleeves, sun glasses (UVA+UVB protection) and wide brim hats (4-inch brim around the entire circumference of the hat) are also recommended for sun protection.  - Call for new or changing lesions.  LENTIGINES, SEBORRHEIC KERATOSES, HEMANGIOMAS - Benign normal skin lesions - Benign-appearing - Call for any changes  MELANOCYTIC NEVI - Tan-brown and/or pink-flesh-colored symmetric macules and papules - Benign appearing on  exam today - Observation - Call clinic for new or changing moles - Recommend daily use of broad spectrum spf 30+ sunscreen to sun-exposed areas.   HISTORY OF BASAL CELL CARCINOMA OF THE SKIN - No evidence of recurrence today - Recommend regular full body skin exams - Recommend daily broad spectrum sunscreen SPF 30+ to sun-exposed areas, reapply every 2 hours as needed.  - Call if any new or changing lesions are noted between office visits   History of Dysplastic Nevi - No evidence of recurrence today - Recommend regular full body skin exams - Recommend daily broad spectrum sunscreen SPF 30+ to sun-exposed areas, reapply every 2 hours as needed.  - Call if any new or changing lesions are noted between office visits   HISTORY OF SQUAMOUS CELL CARCINOMA OF THE SKIN - No evidence of recurrence today - No lymphadenopathy - Recommend regular full body skin exams - Recommend daily broad spectrum sunscreen SPF 30+ to sun-exposed areas, reapply every 2 hours as needed.  - Call if any new or changing lesions are noted between office visits   HISTORY OF PRECANCEROUS ACTINIC KERATOSIS - site(s) of PreCancerous Actinic Keratosis clear today. - these may recur and new lesions may form requiring treatment to prevent transformation into skin cancer - observe for new or changing spots and contact [facility] for appointment if occur - photoprotection with sun protective clothing; sunglasses and broad spectrum sunscreen with SPF of at least 30 + and frequent self skin exams recommended - yearly exams by a dermatologist recommended for persons with history of PreCancerous Actinic Keratoses   HISTORY OF MELANOMA - No evidence of recurrence today at left lower back, 2010 -  Recommend regular full body skin exams - Recommend daily broad spectrum sunscreen SPF 30+ to sun-exposed areas, reapply every 2 hours as needed.  - Call if any new or changing lesions are noted between office  visits    MULTIPLE BENIGN NEVI   LENTIGINES   ACTINIC ELASTOSIS   SEBORRHEIC KERATOSES   CHERRY ANGIOMA   Return in about 1 year (around 08/03/2025) for TBSE, with Dr. Claudene, HxMM, HxDN, HxBCC, HxSCC, HxAK.  LILLETTE Lonell Drones, RMA, am acting as scribe for Boneta Claudene, MD .   Documentation: I have reviewed the above documentation for accuracy and completeness, and I agree with the above.  Boneta Claudene, MD

## 2024-08-17 ENCOUNTER — Ambulatory Visit: Admitting: Nurse Practitioner

## 2024-08-23 ENCOUNTER — Ambulatory Visit: Admitting: Nurse Practitioner

## 2024-08-31 ENCOUNTER — Encounter: Payer: Self-pay | Admitting: Nurse Practitioner

## 2024-08-31 ENCOUNTER — Ambulatory Visit: Admitting: Nurse Practitioner

## 2024-08-31 VITALS — BP 128/86 | HR 87 | Temp 97.4°F | Resp 16 | Ht 66.5 in | Wt 171.8 lb

## 2024-08-31 DIAGNOSIS — F988 Other specified behavioral and emotional disorders with onset usually occurring in childhood and adolescence: Secondary | ICD-10-CM | POA: Diagnosis not present

## 2024-08-31 DIAGNOSIS — I10 Essential (primary) hypertension: Secondary | ICD-10-CM | POA: Diagnosis not present

## 2024-08-31 DIAGNOSIS — R609 Edema, unspecified: Secondary | ICD-10-CM | POA: Diagnosis not present

## 2024-08-31 MED ORDER — AMPHETAMINE-DEXTROAMPHET ER 30 MG PO CP24: 30.0000 mg | ORAL_CAPSULE | Freq: Every day | ORAL | 0 refills | Status: DC

## 2024-08-31 MED ORDER — HYDROCHLOROTHIAZIDE 12.5 MG PO TABS: 12.5000 mg | ORAL_TABLET | Freq: Every day | ORAL | 1 refills | Status: AC

## 2024-08-31 NOTE — Progress Notes (Signed)
 [facility] [address] [place], [place] [address]  Internal MEDICINE  Office Visit Note  Patient Name: April Wilkins  [id number]  [id number]  Date of Service: 08/31/2024  Chief Complaint  Patient presents with   Hypertension   Follow-up    HPI April Wilkins presents for a follow-up visit for ADHD, hypertension, fluid retention and refills.  ADHD -- current dose remains effective. Heart rate and BP are stable. She denies any palpitations or other adverse side effects of the medication. Due for refills today.  Hypertension and fluid retention in her lower extremities -- was previously taking hydrochlorothiazide , wanted to restart this due to some mild swelling.     Current Medication: Outpatient Encounter Medications as of 08/31/2024  Medication Sig   amphetamine -dextroamphetamine  (ADDERALL  XR) 30 MG 24 hr capsule Take 1 capsule (30 mg total) by mouth daily.   [START ON 09/28/2024] amphetamine -dextroamphetamine  (ADDERALL  XR) 30 MG 24 hr capsule Take 1 capsule (30 mg total) by mouth daily.   hydrochlorothiazide  (HYDRODIURIL ) 12.5 MG tablet Take 1 tablet (12.5 mg total) by mouth daily.   meloxicam  (MOBIC ) 15 MG tablet Take 1 tablet (15 mg total) by mouth daily.   valACYclovir  (VALTREX ) 1000 MG tablet Take 1 tablet by mouth twice daily starting at first sign of symptoms and continue until resolved, repeat as needed for outbreaks.   [DISCONTINUED] amphetamine -dextroamphetamine  (ADDERALL  XR) 30 MG 24 hr capsule Take 1 capsule (30 mg total) by mouth daily.   [DISCONTINUED] hydrochlorothiazide  (HYDRODIURIL ) 12.5 MG tablet TAKE 1 TABLET(12.5 MG) BY MOUTH DAILY   [DISCONTINUED] Vitamin D , Ergocalciferol , (DRISDOL ) 1.25 MG (50000 UNIT) CAPS capsule TAKE 1 CAPSULE BY MOUTH EVERY 7 DAYS   No facility-administered encounter medications on file as of 08/31/2024.    Surgical History: Past Surgical History:  Procedure Laterality Date   SKIN BIOPSY      Medical History: Past  Medical History:  Diagnosis Date   Actinic keratosis 08/31/2010   Right forearm. Pigmented.   Allergy    Seasonal   Basal cell carcinoma (BCC) of right upper arm 02/08/2010   Right deltoid. Superficial.    Chicken pox    Dysplastic nevus 02/08/2010   R mid back bra line. Moderate to severe atypia, close to margin. Excised 03/28/2010, margins free.   Dysplastic nevus 02/08/2010   Left mid to low back above MM site. Moderate atypia, close to margin   Dysplastic nevus 02/08/2010   Left sup. lat. buttocks near side. Moderate atypia, close to margin.   Dysplastic nevus 05/22/2015   Left lower lat. back. Moderat to severe atypia, close to margin. Excised 06/12/2015, margins free.   Dysplastic nevus 03/31/2018   Central upper abdomen. Moderate atypia, limited margins free.   Frequent headaches    Hypertension    Melanoma (HCC) 2010   left lower back   Migraines    Squamous cell carcinoma of skin    back   Squamous cell carcinoma of skin 12/20/2020   L upper arm, SCC IS, EDC 01/17/21    Family History: Family History  Problem Relation Age of Onset   Hyperlipidemia Father    Heart disease Father    Diabetes Father    Cancer Father        Skin Cancer   Alcohol abuse Paternal Uncle    Hyperlipidemia Paternal Uncle    Heart disease Paternal Uncle    Kidney disease Maternal Grandfather    Hyperlipidemia Paternal Grandmother    Heart disease Paternal Grandmother  Drug abuse Paternal Grandmother    Breast cancer Maternal Grandmother 50    Social History   Socioeconomic History   Marital status: Single    Spouse name: Not on file   Number of children: Not on file   Years of education: Not on file   Highest education level: Not on file  Occupational History   Not on file  Tobacco Use   Smoking status: Never   Smokeless tobacco: Never  Substance and Sexual Activity   Alcohol use: Yes    Alcohol/week: 0.0 standard drinks of alcohol    Comment: Rare    Drug use: No    Sexual activity: Yes    Partners: Male  Other Topics Concern   Not on file  Social History Narrative   Divorced   Goes by [employer]    Employed- Furniture conservator/restorer    2 children   Caffeine- No coffee, tea and soda daily 24 oz.     Social Drivers of Corporate Investment Banker Strain: Not on file  Food Insecurity: Not on file  Transportation Needs: Not on file  Physical Activity: Not on file  Stress: Not on file  Social Connections: Not on file  Intimate Partner Violence: Not on file      Review of Systems  Constitutional:  Negative for chills, fatigue and unexpected weight change.  HENT:  Negative for congestion, postnasal drip, rhinorrhea, sneezing, sore throat and trouble swallowing.   Eyes:  Negative for redness.  Respiratory:  Negative for cough, chest tightness, shortness of breath and wheezing.   Cardiovascular: Negative.  Negative for chest pain and palpitations.  Gastrointestinal: Negative.  Negative for abdominal pain, constipation, diarrhea, nausea and vomiting.  Genitourinary: Negative.  Negative for dysuria and frequency.  Musculoskeletal: Negative.  Negative for arthralgias, back pain, joint swelling and neck pain.  Skin:  Negative for rash.  Neurological: Negative.  Negative for tremors and numbness.  Hematological:  Negative for adenopathy. Does not bruise/bleed easily.  Psychiatric/Behavioral:  Positive for decreased concentration. Negative for behavioral problems (Depression), sleep disturbance and suicidal ideas. The patient is not nervous/anxious.     Vital Signs: BP 128/86   Pulse 87   Temp (!) 97.4 F (36.3 C)   Resp 16   Ht 5' 6.5 (1.689 m)   Wt 171 lb 12.8 oz (77.9 kg)   SpO2 96%   BMI 27.31 kg/m    Physical Exam Vitals reviewed.  Constitutional:      General: She is not in acute distress.    Appearance: Normal appearance. She is not ill-appearing.  HENT:     Head: Normocephalic and atraumatic.  Eyes:     Pupils: Pupils are  equal, round, and reactive to light.  Cardiovascular:     Rate and Rhythm: Normal rate and regular rhythm.  Pulmonary:     Effort: Pulmonary effort is normal. No respiratory distress.  Neurological:     Mental Status: She is alert and oriented to person, place, and time.  Psychiatric:        Mood and Affect: Mood normal.        Behavior: Behavior normal.        Assessment/Plan: 1. Primary hypertension (Primary) Take hydrochlorothiazide  as prescribed.  - hydrochlorothiazide  (HYDRODIURIL ) 12.5 MG tablet; Take 1 tablet (12.5 mg total) by mouth daily.  Dispense: 90 tablet; Refill: 1  2. Water retention Take hydrochlorothiazide  as prescribed.  - hydrochlorothiazide  (HYDRODIURIL ) 12.5 MG tablet; Take 1 tablet (12.5  mg total) by mouth daily.  Dispense: 90 tablet; Refill: 1  3. Attention deficit disorder (ADD) in adult Continue adderall  as prescribed. Follow up in 3 months for additional refill and for AWV.  - amphetamine -dextroamphetamine  (ADDERALL  XR) 30 MG 24 hr capsule; Take 1 capsule (30 mg total) by mouth daily.  Dispense: 30 capsule; Refill: 0 - amphetamine -dextroamphetamine  (ADDERALL  XR) 30 MG 24 hr capsule; Take 1 capsule (30 mg total) by mouth daily.  Dispense: 30 capsule; Refill: 0   General Counseling: Adeliz verbalizes understanding of the findings of todays visit and agrees with plan of treatment. I have discussed any further diagnostic evaluation that may be needed or ordered today. We also reviewed her medications today. she has been encouraged to call the office with any questions or concerns that should arise related to todays visit.    No orders of the defined types were placed in this encounter.   Meds ordered this encounter  Medications   amphetamine -dextroamphetamine  (ADDERALL  XR) 30 MG 24 hr capsule    Sig: Take 1 capsule (30 mg total) by mouth daily.    Dispense:  30 capsule    Refill:  0    Fill for october   amphetamine -dextroamphetamine  (ADDERALL  XR) 30  MG 24 hr capsule    Sig: Take 1 capsule (30 mg total) by mouth daily.    Dispense:  30 capsule    Refill:  0    Fill for november   hydrochlorothiazide  (HYDRODIURIL ) 12.5 MG tablet    Sig: Take 1 tablet (12.5 mg total) by mouth daily.    Dispense:  90 tablet    Refill:  1    Return in about 3 months (around 12/01/2024) for F/U, ADHD med check, Xan Sparkman PCP.   Total time spent:30 Minutes Time spent includes review of chart, medications, test results, and follow up plan with the patient.   [place] Controlled Substance Database was reviewed by me.  This patient was seen by Mardy Maxin, FNP-C in collaboration with Dr. Sigrid Bathe as a part of collaborative care agreement.   Taran Haynesworth R. Maxin, MSN, FNP-C Internal medicine

## 2024-09-20 ENCOUNTER — Encounter: Payer: Self-pay | Admitting: Nurse Practitioner

## 2024-09-22 ENCOUNTER — Telehealth: Payer: Self-pay | Admitting: Nurse Practitioner

## 2024-09-22 NOTE — Telephone Encounter (Signed)
 Received [facility] paperwork from patient. Gave to Alyssa to complete-Toni

## 2024-09-23 ENCOUNTER — Other Ambulatory Visit: Payer: Self-pay | Admitting: Nurse Practitioner

## 2024-09-23 ENCOUNTER — Telehealth: Payer: Self-pay | Admitting: Nurse Practitioner

## 2024-09-23 DIAGNOSIS — E559 Vitamin D deficiency, unspecified: Secondary | ICD-10-CM

## 2024-09-23 NOTE — Telephone Encounter (Signed)
 Paperwork for [employer] completed. Emailed back to patient's daughter. Scanned-Toni

## 2024-09-25 ENCOUNTER — Encounter: Payer: Self-pay | Admitting: Nurse Practitioner

## 2024-10-26 ENCOUNTER — Encounter: Payer: Self-pay | Admitting: Nurse Practitioner

## 2024-10-26 NOTE — Telephone Encounter (Signed)
 Spoke with pt she already had refills accidentally send us  message

## 2024-10-27 ENCOUNTER — Encounter: Payer: Self-pay | Admitting: Nurse Practitioner

## 2024-10-27 ENCOUNTER — Telehealth: Payer: Self-pay | Admitting: Nurse Practitioner

## 2024-10-27 ENCOUNTER — Ambulatory Visit (INDEPENDENT_AMBULATORY_CARE_PROVIDER_SITE_OTHER): Payer: BC Managed Care – PPO | Admitting: Nurse Practitioner

## 2024-10-27 VITALS — BP 126/88 | HR 86 | Temp 95.7°F | Resp 16 | Ht 66.5 in | Wt 174.8 lb

## 2024-10-27 DIAGNOSIS — I10 Essential (primary) hypertension: Secondary | ICD-10-CM

## 2024-10-27 DIAGNOSIS — R7303 Prediabetes: Secondary | ICD-10-CM

## 2024-10-27 DIAGNOSIS — F988 Other specified behavioral and emotional disorders with onset usually occurring in childhood and adolescence: Secondary | ICD-10-CM

## 2024-10-27 DIAGNOSIS — E782 Mixed hyperlipidemia: Secondary | ICD-10-CM

## 2024-10-27 DIAGNOSIS — H9113 Presbycusis, bilateral: Secondary | ICD-10-CM | POA: Diagnosis not present

## 2024-10-27 DIAGNOSIS — Z0001 Encounter for general adult medical examination with abnormal findings: Secondary | ICD-10-CM

## 2024-10-27 DIAGNOSIS — Z23 Encounter for immunization: Secondary | ICD-10-CM

## 2024-10-27 DIAGNOSIS — R300 Dysuria: Secondary | ICD-10-CM

## 2024-10-27 DIAGNOSIS — E2839 Other primary ovarian failure: Secondary | ICD-10-CM

## 2024-10-27 MED ORDER — PNEUMOCOCCAL 20-VAL CONJ VACC 0.5 ML IM SUSY: 0.5000 mL | PREFILLED_SYRINGE | Freq: Once | INTRAMUSCULAR | 0 refills | Status: AC | PRN

## 2024-10-27 NOTE — Progress Notes (Signed)
 [facility] [address] [place], [place] [address]  Internal MEDICINE  Office Visit Note  Patient Name: April Wilkins  [id number]  [id number]  Date of Service: 10/27/2024  Chief Complaint  Patient presents with   Hypertension   Annual Exam    HPI  April Wilkins presents for her initial welcome to medicare annual wellness visit.  Well-appearing 63 y.o. female with hypertension, insomnia, ADD, seasonal allergies  Routine CRC screening: cologuard due in 2027 Routine mammogram: done in April this year, due in April next year  DEXA scan: due now  Pap smear: discontinue, aged out. Patient declined getting 1 more pap smear done.  Labs: labs were ordered in July, patient still needs to have them drawn.  New or worsening pain: none  Other concerns: none  Patient wants to stop taking adderall , planning to retire next year Has some presbycusis   Current Medication: Outpatient Encounter Medications as of 10/27/2024  Medication Sig   pneumococcal 20-valent conjugate vaccine (PREVNAR 20) 0.5 ML injection Inject 0.5 mLs into the muscle once as needed for up to 1 dose for immunization.   hydrochlorothiazide  (HYDRODIURIL ) 12.5 MG tablet Take 1 tablet (12.5 mg total) by mouth daily.   valACYclovir  (VALTREX ) 1000 MG tablet Take 1 tablet by mouth twice daily starting at first sign of symptoms and continue until resolved, repeat as needed for outbreaks.   [DISCONTINUED] amphetamine -dextroamphetamine  (ADDERALL  XR) 30 MG 24 hr capsule Take 1 capsule (30 mg total) by mouth daily.   [DISCONTINUED] amphetamine -dextroamphetamine  (ADDERALL  XR) 30 MG 24 hr capsule Take 1 capsule (30 mg total) by mouth daily.   [DISCONTINUED] meloxicam  (MOBIC ) 15 MG tablet Take 1 tablet (15 mg total) by mouth daily.   [DISCONTINUED] Vitamin D , Ergocalciferol , (DRISDOL ) 1.25 MG (50000 UNIT) CAPS capsule TAKE 1 CAPSULE BY MOUTH EVERY 7 DAYS (Patient not taking: Reported on 10/27/2024)   No facility-administered  encounter medications on file as of 10/27/2024.    Surgical History: Past Surgical History:  Procedure Laterality Date   SKIN BIOPSY      Medical History: Past Medical History:  Diagnosis Date   Actinic keratosis 08/31/2010   Right forearm. Pigmented.   Allergy    Seasonal   Basal cell carcinoma (BCC) of right upper arm 02/08/2010   Right deltoid. Superficial.    Chicken pox    Dysplastic nevus 02/08/2010   R mid back bra line. Moderate to severe atypia, close to margin. Excised 03/28/2010, margins free.   Dysplastic nevus 02/08/2010   Left mid to low back above MM site. Moderate atypia, close to margin   Dysplastic nevus 02/08/2010   Left sup. lat. buttocks near side. Moderate atypia, close to margin.   Dysplastic nevus 05/22/2015   Left lower lat. back. Moderat to severe atypia, close to margin. Excised 06/12/2015, margins free.   Dysplastic nevus 03/31/2018   Central upper abdomen. Moderate atypia, limited margins free.   Frequent headaches    Hypertension    Melanoma (HCC) 2010   left lower back   Migraines    Squamous cell carcinoma of skin    back   Squamous cell carcinoma of skin 12/20/2020   L upper arm, SCC IS, EDC 01/17/21    Family History: Family History  Problem Relation Age of Onset   Hyperlipidemia Father    Heart disease Father    Diabetes Father    Cancer Father        Skin Cancer   Alcohol abuse Paternal Uncle  Hyperlipidemia Paternal Uncle    Heart disease Paternal Uncle    Kidney disease Maternal Grandfather    Hyperlipidemia Paternal Grandmother    Heart disease Paternal Grandmother    Drug abuse Paternal Grandmother    Breast cancer Maternal Grandmother 21    Social History   Socioeconomic History   Marital status: Single    Spouse name: Not on file   Number of children: Not on file   Years of education: Not on file   Highest education level: Not on file  Occupational History   Not on file  Tobacco Use   Smoking status: Never    Smokeless tobacco: Never  Substance and Sexual Activity   Alcohol use: Yes    Alcohol/week: 0.0 standard drinks of alcohol    Comment: Rare    Drug use: No   Sexual activity: Yes    Partners: Male  Other Topics Concern   Not on file  Social History Narrative   Divorced   Goes by [employer]    Employed- Furniture conservator/restorer    2 children   Caffeine- No coffee, tea and soda daily 24 oz.     Social Drivers of Health   Tobacco Use: Low Risk (10/27/2024)   Patient History    Smoking Tobacco Use: Never    Smokeless Tobacco Use: Never    Passive Exposure: Not on file  Financial Resource Strain: Not on file  Food Insecurity: Not on file  Transportation Needs: Not on file  Physical Activity: Not on file  Stress: Not on file  Social Connections: Not on file  Intimate Partner Violence: Not on file  Depression (PHQ2-9): Low Risk (10/27/2024)   Depression (PHQ2-9)    PHQ-2 Score: 0  Alcohol Screen: Low Risk (06/11/2022)   Alcohol Screen    Last Alcohol Screening Score (AUDIT): 1  Housing: Not on file  Utilities: Not on file  Health Literacy: Not on file      Review of Systems  Constitutional:  Negative for activity change, appetite change, chills, fatigue, fever and unexpected weight change.  HENT:  Positive for hearing loss. Negative for congestion, ear pain, rhinorrhea, sore throat and trouble swallowing.   Eyes: Negative.   Respiratory: Negative.  Negative for cough, chest tightness, shortness of breath and wheezing.   Cardiovascular: Negative.  Negative for chest pain.  Gastrointestinal: Negative.  Negative for abdominal pain, blood in stool, constipation, diarrhea, nausea and vomiting.  Endocrine: Negative.   Genitourinary: Negative.  Negative for difficulty urinating, dysuria, frequency, hematuria and urgency.  Musculoskeletal: Negative.  Negative for arthralgias, back pain, joint swelling, myalgias and neck pain.  Skin: Negative.  Negative for rash and wound.   Allergic/Immunologic: Negative.  Negative for immunocompromised state.  Neurological: Negative.  Negative for dizziness, seizures, numbness and headaches.  Hematological: Negative.   Psychiatric/Behavioral: Negative.  Negative for behavioral problems, self-injury and suicidal ideas. The patient is not nervous/anxious.     Vital Signs: BP 126/88   Pulse 86   Temp (!) 95.7 F (35.4 C)   Resp 16   Ht 5' 6.5 (1.689 m)   Wt 174 lb 12.8 oz (79.3 kg)   SpO2 97%   BMI 27.79 kg/m    Physical Exam Vitals reviewed.  Constitutional:      General: She is awake. She is not in acute distress.    Appearance: Normal appearance. She is well-developed, well-groomed and overweight. She is not ill-appearing or diaphoretic.  HENT:  Head: Normocephalic and atraumatic.     Right Ear: Tympanic membrane, ear canal and external ear normal. Decreased hearing noted.     Left Ear: Tympanic membrane, ear canal and external ear normal. Decreased hearing noted.     Nose: Nose normal. No congestion or rhinorrhea.     Mouth/Throat:     Lips: Pink.     Mouth: Mucous membranes are moist.     Pharynx: Oropharynx is clear. Uvula midline. No oropharyngeal exudate or posterior oropharyngeal erythema.  Eyes:     General: Lids are normal. Vision grossly intact. Gaze aligned appropriately. No scleral icterus.       Right eye: No discharge.        Left eye: No discharge.     Conjunctiva/sclera: Conjunctivae normal.     Pupils: Pupils are equal, round, and reactive to light.     Funduscopic exam:    Right eye: Red reflex present.        Left eye: Red reflex present. Neck:     Thyroid: No thyromegaly.     Vascular: No JVD.     Trachea: Trachea and phonation normal. No tracheal deviation.  Cardiovascular:     Rate and Rhythm: Normal rate and regular rhythm.     Pulses: Normal pulses.     Heart sounds: Normal heart sounds, S1 normal and S2 normal. No murmur heard.    No friction rub. No gallop.  Pulmonary:      Effort: Pulmonary effort is normal. No accessory muscle usage or respiratory distress.     Breath sounds: Normal breath sounds and air entry. No stridor. No wheezing or rales.  Chest:     Chest wall: No tenderness.     Comments: Declined clinical breast exam, gets annual mammograms Abdominal:     General: Bowel sounds are normal. There is no distension.     Palpations: Abdomen is soft. There is no shifting dullness, fluid wave, mass or pulsatile mass.     Tenderness: There is no abdominal tenderness. There is no guarding or rebound.  Musculoskeletal:        General: No tenderness or deformity. Normal range of motion.     Cervical back: Normal range of motion and neck supple.     Right lower leg: No edema.     Left lower leg: No edema.  Lymphadenopathy:     Cervical: No cervical adenopathy.  Skin:    General: Skin is warm and dry.     Capillary Refill: Capillary refill takes less than 2 seconds.     Coloration: Skin is not pale.     Findings: No erythema or rash.  Neurological:     Mental Status: She is alert and oriented to person, place, and time.     Cranial Nerves: No cranial nerve deficit.     Motor: No abnormal muscle tone.     Coordination: Coordination normal.     Gait: Gait normal.     Deep Tendon Reflexes: Reflexes are normal and symmetric.  Psychiatric:        Mood and Affect: Mood normal.        Behavior: Behavior normal. Behavior is cooperative.        Thought Content: Thought content normal.        Judgment: Judgment normal.        Assessment/Plan: 1. Encounter for Medicare annual examination with abnormal findings (Primary) Age-appropriate preventive screenings and vaccinations discussed, annual physical exam completed. Routine labs for health maintenance were  previously ordered, patient reminded to have her labs drawn. [facility] PHM updated.    2. Primary hypertension Stable, continue hydrochlorothiazide  as prescribed.   3. Prediabetes Continue low carb low  sugar diet and physical activity as tolerated.   4. Mixed hyperlipidemia Continue diet and exercise as previously discussed.   5. Presbycusis of both ears plans to ask her local ENT if she can be seen there or if she needs a referral. Will call if they require a referral   6. Ovarian failure due to menopause Dexa scan ordered  - DG Bone Density; Future  7. Dysuria Urine sent to lab - UA/M w/rflx Culture, Routine - Microscopic Examination - Urine Culture, Reflex  8. Need for vaccination - pneumococcal 20-valent conjugate vaccine (PREVNAR 20) 0.5 ML injection; Inject 0.5 mLs into the muscle once as needed for up to 1 dose for immunization.  Dispense: 0.5 mL; Refill: 0  9. Attention deficit disorder (ADD) in adult Adderall  is discontinued. Patient does not want to continue to take the medication anymore and will be retiring soon.      General Counseling: April Wilkins understanding of the findings of todays visit and agrees with plan of treatment. I have discussed any further diagnostic evaluation that may be needed or ordered today. We also reviewed her medications today. she has been encouraged to call the office with any questions or concerns that should arise related to todays visit.    Orders Placed This Encounter  Procedures   Microscopic Examination   Urine Culture, Reflex   DG Bone Density   UA/M w/rflx Culture, Routine    Meds ordered this encounter  Medications   pneumococcal 20-valent conjugate vaccine (PREVNAR 20) 0.5 ML injection    Sig: Inject 0.5 mLs into the muscle once as needed for up to 1 dose for immunization.    Dispense:  0.5 mL    Refill:  0    Due for prevnar 20 vaccine    Return in about 6 months (around 04/27/2025) for F/U, Janifer Gieselman PCP.   Total time spent:30 Minutes Time spent includes review of chart, medications, test results, and follow up plan with the patient.   [place] Controlled Substance Database was reviewed by me.  This patient was  seen by Mardy Maxin, FNP-C in collaboration with Dr. Sigrid Bathe as a part of collaborative care agreement.  Kaydee Magel R. Maxin, MSN, FNP-C Internal medicine

## 2024-10-27 NOTE — Telephone Encounter (Signed)
 Emailed letter to patient stating she was seen today for her annual physical-. Scanned-Toni

## 2024-10-28 ENCOUNTER — Encounter: Payer: Self-pay | Admitting: Nurse Practitioner

## 2024-10-30 LAB — B12 AND FOLATE PANEL
Folate: 7.9 ng/mL
Vitamin B-12: 419 pg/mL (ref 232–1245)

## 2024-10-30 LAB — CMP14+EGFR
ALT: 19 IU/L (ref 0–32)
AST: 19 IU/L (ref 0–40)
Albumin: 4 g/dL (ref 3.9–4.9)
Alkaline Phosphatase: 114 IU/L (ref 49–135)
BUN/Creatinine Ratio: 20 (ref 12–28)
BUN: 20 mg/dL (ref 8–27)
Bilirubin Total: 0.4 mg/dL (ref 0.0–1.2)
CO2: 26 mmol/L (ref 20–29)
Calcium: 9.5 mg/dL (ref 8.7–10.3)
Chloride: 102 mmol/L (ref 96–106)
Creatinine, Ser: 1 mg/dL (ref 0.57–1.00)
Globulin, Total: 3.1 g/dL (ref 1.5–4.5)
Glucose: 95 mg/dL (ref 70–99)
Potassium: 4.1 mmol/L (ref 3.5–5.2)
Sodium: 141 mmol/L (ref 134–144)
Total Protein: 7.1 g/dL (ref 6.0–8.5)
eGFR: 63 mL/min/1.73

## 2024-10-30 LAB — CBC WITH DIFFERENTIAL/PLATELET
Basophils Absolute: 0.1 x10E3/uL (ref 0.0–0.2)
Basos: 1 %
EOS (ABSOLUTE): 0.2 x10E3/uL (ref 0.0–0.4)
Eos: 3 %
Hematocrit: 46.4 % (ref 34.0–46.6)
Hemoglobin: 14.9 g/dL (ref 11.1–15.9)
Immature Grans (Abs): 0 x10E3/uL (ref 0.0–0.1)
Immature Granulocytes: 0 %
Lymphocytes Absolute: 2.6 x10E3/uL (ref 0.7–3.1)
Lymphs: 36 %
MCH: 28.7 pg (ref 26.6–33.0)
MCHC: 32.1 g/dL (ref 31.5–35.7)
MCV: 89 fL (ref 79–97)
Monocytes Absolute: 0.5 x10E3/uL (ref 0.1–0.9)
Monocytes: 7 %
Neutrophils Absolute: 3.9 x10E3/uL (ref 1.4–7.0)
Neutrophils: 53 %
Platelets: 386 x10E3/uL (ref 150–450)
RBC: 5.19 x10E6/uL (ref 3.77–5.28)
RDW: 12.3 % (ref 11.7–15.4)
WBC: 7.3 x10E3/uL (ref 3.4–10.8)

## 2024-10-30 LAB — UA/M W/RFLX CULTURE, ROUTINE
Bilirubin, UA: NEGATIVE
Glucose, UA: NEGATIVE
Ketones, UA: NEGATIVE
Nitrite, UA: POSITIVE — AB
Protein,UA: NEGATIVE
Specific Gravity, UA: 1.019 (ref 1.005–1.030)
Urobilinogen, Ur: 0.2 mg/dL (ref 0.2–1.0)
pH, UA: 5 (ref 5.0–7.5)

## 2024-10-30 LAB — URINE CULTURE, REFLEX

## 2024-10-30 LAB — MICROSCOPIC EXAMINATION
Casts: NONE SEEN /LPF
Epithelial Cells (non renal): >10 /HPF — AB (ref 0–10)
RBC, Urine: NONE SEEN /HPF (ref 0–2)
WBC, UA: >30 /HPF — AB (ref 0–5)

## 2024-10-30 LAB — LIPID PANEL
Chol/HDL Ratio: 4 ratio (ref 0.0–4.4)
Cholesterol, Total: 190 mg/dL (ref 100–199)
HDL: 47 mg/dL
LDL Chol Calc (NIH): 123 mg/dL — ABNORMAL HIGH (ref 0–99)
Triglycerides: 113 mg/dL (ref 0–149)
VLDL Cholesterol Cal: 20 mg/dL (ref 5–40)

## 2024-10-30 LAB — HGB A1C W/O EAG: Hgb A1c MFr Bld: 5.8 % — ABNORMAL HIGH (ref 4.8–5.6)

## 2024-10-30 LAB — VITAMIN D 25 HYDROXY (VIT D DEFICIENCY, FRACTURES): Vit D, 25-Hydroxy: 27.1 ng/mL — ABNORMAL LOW (ref 30.0–100.0)

## 2024-11-01 MED ORDER — CIPROFLOXACIN HCL 500 MG PO TABS: 500.0000 mg | ORAL_TABLET | Freq: Two times a day (BID) | ORAL | 0 refills | Status: AC

## 2024-11-10 ENCOUNTER — Encounter: Payer: Self-pay | Admitting: Nurse Practitioner

## 2024-11-15 ENCOUNTER — Ambulatory Visit: Payer: Self-pay | Admitting: Nurse Practitioner

## 2024-11-16 NOTE — Telephone Encounter (Signed)
"  Patient notified   "

## 2024-11-16 NOTE — Telephone Encounter (Signed)
-----   Message from Mardy Maxin, NP sent at 11/15/2024  7:29 AM EST ----- Slightly low vitamin D , continue OTC supplement.   LDL is still slightly elevated.  Limit red meat in diet, increase lean proteins in diet and increase physical activity as tolerated. She should try to do something daily even if it is just walking.   A1c is stable but still prediabetic.   The rest of her labs are normal.

## 2024-11-17 ENCOUNTER — Other Ambulatory Visit

## 2024-12-08 ENCOUNTER — Ambulatory Visit

## 2025-01-05 ENCOUNTER — Ambulatory Visit

## 2025-04-27 ENCOUNTER — Ambulatory Visit: Admitting: Nurse Practitioner

## 2025-08-04 ENCOUNTER — Ambulatory Visit: Admitting: Dermatology

## 2025-10-28 ENCOUNTER — Encounter: Admitting: Nurse Practitioner
